# Patient Record
Sex: Female | Born: 1940 | Race: White | Hispanic: No | Marital: Married | Smoking: Never smoker
Health system: Southern US, Community
[De-identification: ages and names within clinical notes are randomized; demographics above are authoritative.]

## PROBLEM LIST (undated history)

## (undated) DIAGNOSIS — R112 Nausea with vomiting, unspecified: Secondary | ICD-10-CM

## (undated) DIAGNOSIS — F419 Anxiety disorder, unspecified: Secondary | ICD-10-CM

## (undated) DIAGNOSIS — J449 Chronic obstructive pulmonary disease, unspecified: Secondary | ICD-10-CM

## (undated) DIAGNOSIS — Z9889 Other specified postprocedural states: Secondary | ICD-10-CM

## (undated) DIAGNOSIS — F429 Obsessive-compulsive disorder, unspecified: Secondary | ICD-10-CM

## (undated) DIAGNOSIS — R7303 Prediabetes: Secondary | ICD-10-CM

## (undated) DIAGNOSIS — K219 Gastro-esophageal reflux disease without esophagitis: Secondary | ICD-10-CM

## (undated) HISTORY — PX: TONSILLECTOMY: SUR1361

## (undated) HISTORY — PX: TUMOR EXCISION: SHX421

## (undated) HISTORY — PX: ABDOMINAL HYSTERECTOMY: SHX81

## (undated) HISTORY — PX: OOPHORECTOMY: SHX86

---

## 2005-09-10 ENCOUNTER — Ambulatory Visit: Payer: Self-pay | Admitting: Internal Medicine

## 2006-09-14 ENCOUNTER — Ambulatory Visit: Payer: Self-pay | Admitting: Internal Medicine

## 2007-03-23 ENCOUNTER — Ambulatory Visit: Payer: Self-pay | Admitting: Gastroenterology

## 2007-10-20 ENCOUNTER — Ambulatory Visit: Payer: Self-pay | Admitting: Internal Medicine

## 2008-04-21 ENCOUNTER — Emergency Department: Payer: Self-pay | Admitting: Emergency Medicine

## 2008-04-23 ENCOUNTER — Other Ambulatory Visit: Payer: Self-pay

## 2008-04-23 ENCOUNTER — Emergency Department: Payer: Self-pay | Admitting: Emergency Medicine

## 2008-10-22 ENCOUNTER — Ambulatory Visit: Payer: Self-pay | Admitting: Internal Medicine

## 2009-10-24 ENCOUNTER — Ambulatory Visit: Payer: Self-pay | Admitting: Internal Medicine

## 2010-10-28 ENCOUNTER — Ambulatory Visit: Payer: Self-pay | Admitting: Internal Medicine

## 2011-05-23 ENCOUNTER — Emergency Department: Payer: Self-pay | Admitting: *Deleted

## 2011-05-27 ENCOUNTER — Emergency Department: Payer: Self-pay | Admitting: Emergency Medicine

## 2011-07-09 ENCOUNTER — Ambulatory Visit: Payer: Self-pay | Admitting: Allergy

## 2011-11-20 ENCOUNTER — Ambulatory Visit: Payer: Self-pay | Admitting: Family Medicine

## 2012-09-21 ENCOUNTER — Ambulatory Visit: Payer: Self-pay | Admitting: Gastroenterology

## 2012-12-01 ENCOUNTER — Ambulatory Visit: Payer: Self-pay | Admitting: Obstetrics and Gynecology

## 2013-11-02 HISTORY — PX: BREAST BIOPSY: SHX20

## 2013-11-30 ENCOUNTER — Emergency Department: Payer: Self-pay | Admitting: Emergency Medicine

## 2013-11-30 LAB — CBC WITH DIFFERENTIAL/PLATELET
Basophil #: 0.1 10*3/uL (ref 0.0–0.1)
Basophil %: 0.4 %
Eosinophil #: 0 10*3/uL (ref 0.0–0.7)
Eosinophil %: 0.3 %
HCT: 39 % (ref 35.0–47.0)
HGB: 13.3 g/dL (ref 12.0–16.0)
LYMPHS PCT: 8.5 %
Lymphocyte #: 1.4 10*3/uL (ref 1.0–3.6)
MCH: 28.6 pg (ref 26.0–34.0)
MCHC: 34 g/dL (ref 32.0–36.0)
MCV: 84 fL (ref 80–100)
MONOS PCT: 7.3 %
Monocyte #: 1.2 x10 3/mm — ABNORMAL HIGH (ref 0.2–0.9)
Neutrophil #: 13.8 10*3/uL — ABNORMAL HIGH (ref 1.4–6.5)
Neutrophil %: 83.5 %
Platelet: 294 10*3/uL (ref 150–440)
RBC: 4.65 10*6/uL (ref 3.80–5.20)
RDW: 14 % (ref 11.5–14.5)
WBC: 16.5 10*3/uL — AB (ref 3.6–11.0)

## 2013-11-30 LAB — URINALYSIS, COMPLETE
Bacteria: NONE SEEN
Bilirubin,UR: NEGATIVE
Blood: NEGATIVE
Glucose,UR: NEGATIVE mg/dL (ref 0–75)
KETONE: NEGATIVE
Nitrite: NEGATIVE
PH: 7 (ref 4.5–8.0)
Protein: NEGATIVE
RBC,UR: 2 /HPF (ref 0–5)
Specific Gravity: 1.017 (ref 1.003–1.030)
Squamous Epithelial: 4
WBC UR: 5 /HPF (ref 0–5)

## 2013-11-30 LAB — COMPREHENSIVE METABOLIC PANEL
ALT: 16 U/L (ref 12–78)
Albumin: 3.9 g/dL (ref 3.4–5.0)
Alkaline Phosphatase: 91 U/L
Anion Gap: 6 — ABNORMAL LOW (ref 7–16)
BUN: 12 mg/dL (ref 7–18)
Bilirubin,Total: 0.5 mg/dL (ref 0.2–1.0)
CHLORIDE: 99 mmol/L (ref 98–107)
Calcium, Total: 9.2 mg/dL (ref 8.5–10.1)
Co2: 31 mmol/L (ref 21–32)
Creatinine: 0.77 mg/dL (ref 0.60–1.30)
Glucose: 111 mg/dL — ABNORMAL HIGH (ref 65–99)
Osmolality: 272 (ref 275–301)
Potassium: 3.1 mmol/L — ABNORMAL LOW (ref 3.5–5.1)
SGOT(AST): 11 U/L — ABNORMAL LOW (ref 15–37)
Sodium: 136 mmol/L (ref 136–145)
TOTAL PROTEIN: 8.1 g/dL (ref 6.4–8.2)

## 2013-11-30 LAB — LIPASE, BLOOD: Lipase: 88 U/L (ref 73–393)

## 2013-12-05 ENCOUNTER — Ambulatory Visit: Payer: Self-pay | Admitting: Obstetrics and Gynecology

## 2013-12-06 ENCOUNTER — Ambulatory Visit: Payer: Self-pay | Admitting: Obstetrics and Gynecology

## 2013-12-11 ENCOUNTER — Ambulatory Visit: Payer: Self-pay | Admitting: Obstetrics and Gynecology

## 2013-12-12 LAB — PATHOLOGY REPORT

## 2014-01-15 ENCOUNTER — Observation Stay: Payer: Self-pay | Admitting: Family Medicine

## 2014-01-15 LAB — CBC WITH DIFFERENTIAL/PLATELET
BASOS ABS: 0 10*3/uL (ref 0.0–0.1)
Basophil %: 0.3 %
EOS PCT: 0.1 %
Eosinophil #: 0 10*3/uL (ref 0.0–0.7)
HCT: 37.1 % (ref 35.0–47.0)
HGB: 12.8 g/dL (ref 12.0–16.0)
LYMPHS ABS: 1.2 10*3/uL (ref 1.0–3.6)
Lymphocyte %: 8 %
MCH: 28.6 pg (ref 26.0–34.0)
MCHC: 34.4 g/dL (ref 32.0–36.0)
MCV: 83 fL (ref 80–100)
MONOS PCT: 11.1 %
Monocyte #: 1.7 x10 3/mm — ABNORMAL HIGH (ref 0.2–0.9)
NEUTROS PCT: 80.5 %
Neutrophil #: 12.6 10*3/uL — ABNORMAL HIGH (ref 1.4–6.5)
PLATELETS: 305 10*3/uL (ref 150–440)
RBC: 4.47 10*6/uL (ref 3.80–5.20)
RDW: 14.4 % (ref 11.5–14.5)
WBC: 15.6 10*3/uL — AB (ref 3.6–11.0)

## 2014-01-15 LAB — URINALYSIS, COMPLETE
BILIRUBIN, UR: NEGATIVE
Glucose,UR: NEGATIVE mg/dL (ref 0–75)
Nitrite: NEGATIVE
PH: 7 (ref 4.5–8.0)
Protein: NEGATIVE
RBC,UR: 3 /HPF (ref 0–5)
SPECIFIC GRAVITY: 1.008 (ref 1.003–1.030)
Squamous Epithelial: 9
Transitional Epi: 2

## 2014-01-15 LAB — COMPREHENSIVE METABOLIC PANEL
ALT: 14 U/L (ref 12–78)
Albumin: 3.2 g/dL — ABNORMAL LOW (ref 3.4–5.0)
Alkaline Phosphatase: 89 U/L
Anion Gap: 8 (ref 7–16)
BUN: 10 mg/dL (ref 7–18)
Bilirubin,Total: 0.7 mg/dL (ref 0.2–1.0)
CALCIUM: 9.3 mg/dL (ref 8.5–10.1)
CHLORIDE: 95 mmol/L — AB (ref 98–107)
Co2: 27 mmol/L (ref 21–32)
Creatinine: 0.73 mg/dL (ref 0.60–1.30)
EGFR (Non-African Amer.): >60
GLUCOSE: 114 mg/dL — AB (ref 65–99)
OSMOLALITY: 261 (ref 275–301)
POTASSIUM: 2.7 mmol/L — AB (ref 3.5–5.1)
SGOT(AST): 13 U/L — ABNORMAL LOW (ref 15–37)
Sodium: 130 mmol/L — ABNORMAL LOW (ref 136–145)
TOTAL PROTEIN: 7.7 g/dL (ref 6.4–8.2)

## 2014-01-15 LAB — LIPASE, BLOOD: Lipase: 74 U/L (ref 73–393)

## 2014-01-16 LAB — CBC WITH DIFFERENTIAL/PLATELET
BASOS ABS: 0 10*3/uL (ref 0.0–0.1)
BASOS PCT: 0.1 %
Eosinophil #: 0.1 10*3/uL (ref 0.0–0.7)
Eosinophil %: 0.8 %
HCT: 34.2 % — ABNORMAL LOW (ref 35.0–47.0)
HGB: 11.8 g/dL — AB (ref 12.0–16.0)
LYMPHS ABS: 2.7 10*3/uL (ref 1.0–3.6)
LYMPHS PCT: 24.9 %
MCH: 28.4 pg (ref 26.0–34.0)
MCHC: 34.4 g/dL (ref 32.0–36.0)
MCV: 83 fL (ref 80–100)
MONO ABS: 1.6 x10 3/mm — AB (ref 0.2–0.9)
Monocyte %: 14.2 %
NEUTROS PCT: 60 %
Neutrophil #: 6.6 10*3/uL — ABNORMAL HIGH (ref 1.4–6.5)
PLATELETS: 305 10*3/uL (ref 150–440)
RBC: 4.14 10*6/uL (ref 3.80–5.20)
RDW: 14.3 % (ref 11.5–14.5)
WBC: 11 10*3/uL (ref 3.6–11.0)

## 2014-01-16 LAB — BASIC METABOLIC PANEL
ANION GAP: 6 — AB (ref 7–16)
BUN: 6 mg/dL — AB (ref 7–18)
Calcium, Total: 8.5 mg/dL (ref 8.5–10.1)
Chloride: 102 mmol/L (ref 98–107)
Co2: 29 mmol/L (ref 21–32)
Creatinine: 0.82 mg/dL (ref 0.60–1.30)
EGFR (African American): >60
EGFR (Non-African Amer.): >60
GLUCOSE: 86 mg/dL (ref 65–99)
OSMOLALITY: 271 (ref 275–301)
Potassium: 2.5 mmol/L — CL (ref 3.5–5.1)
Sodium: 137 mmol/L (ref 136–145)

## 2014-01-16 LAB — MAGNESIUM: Magnesium: 1.1 mg/dL — ABNORMAL LOW

## 2014-01-16 LAB — URINE CULTURE

## 2014-01-17 LAB — CBC WITH DIFFERENTIAL/PLATELET
Basophil #: 0 10*3/uL (ref 0.0–0.1)
Basophil %: 0.5 %
EOS ABS: 0 10*3/uL (ref 0.0–0.7)
EOS PCT: 0 %
HCT: 34.1 % — AB (ref 35.0–47.0)
HGB: 11.6 g/dL — AB (ref 12.0–16.0)
LYMPHS PCT: 10.3 %
Lymphocyte #: 1 10*3/uL (ref 1.0–3.6)
MCH: 28.7 pg (ref 26.0–34.0)
MCHC: 34.2 g/dL (ref 32.0–36.0)
MCV: 84 fL (ref 80–100)
MONO ABS: 0.6 x10 3/mm (ref 0.2–0.9)
MONOS PCT: 6.4 %
Neutrophil #: 8.3 10*3/uL — ABNORMAL HIGH (ref 1.4–6.5)
Neutrophil %: 82.8 %
Platelet: 315 10*3/uL (ref 150–440)
RBC: 4.05 10*6/uL (ref 3.80–5.20)
RDW: 14.6 % — AB (ref 11.5–14.5)
WBC: 10 10*3/uL (ref 3.6–11.0)

## 2014-01-17 LAB — BASIC METABOLIC PANEL
Anion Gap: 8 (ref 7–16)
BUN: 7 mg/dL (ref 7–18)
CHLORIDE: 106 mmol/L (ref 98–107)
CREATININE: 0.67 mg/dL (ref 0.60–1.30)
Calcium, Total: 8.5 mg/dL (ref 8.5–10.1)
Co2: 25 mmol/L (ref 21–32)
EGFR (Non-African Amer.): >60
Glucose: 128 mg/dL — ABNORMAL HIGH (ref 65–99)
OSMOLALITY: 277 (ref 275–301)
POTASSIUM: 3.5 mmol/L (ref 3.5–5.1)
Sodium: 139 mmol/L (ref 136–145)

## 2014-01-18 LAB — BASIC METABOLIC PANEL
Anion Gap: 8 (ref 7–16)
BUN: 13 mg/dL (ref 7–18)
CHLORIDE: 104 mmol/L (ref 98–107)
Calcium, Total: 9 mg/dL (ref 8.5–10.1)
Co2: 25 mmol/L (ref 21–32)
Creatinine: 0.69 mg/dL (ref 0.60–1.30)
EGFR (African American): >60
Glucose: 100 mg/dL — ABNORMAL HIGH (ref 65–99)
OSMOLALITY: 274 (ref 275–301)
Potassium: 3.4 mmol/L — ABNORMAL LOW (ref 3.5–5.1)
Sodium: 137 mmol/L (ref 136–145)

## 2014-12-10 ENCOUNTER — Ambulatory Visit: Payer: Self-pay | Admitting: Obstetrics and Gynecology

## 2014-12-11 ENCOUNTER — Ambulatory Visit: Payer: Self-pay | Admitting: Obstetrics and Gynecology

## 2015-02-23 NOTE — Discharge Summary (Signed)
PATIENT NAME:  Gail Salinas, Gail Salinas MR#:  [id number] DATE OF BIRTH:  October 08, 1941  DATE OF ADMISSION:  01/15/2014 DATE OF DISCHARGE:  01/18/2014  DISCHARGE DIAGNOSES: 1.  Intractable nausea, vomiting that has resolved.  2.  Chronic hypomagnesemia and hypokalemia.  3.  Urinary tract infection.   DISCHARGE MEDICATIONS: 1.  Potassium chloride 20 mEq 4 times a day, which is per her home dose.  2.  Aspirin 81 mg p.o. daily.  3.  Gabapentin 300 mg p.o. at bedtime as needed for neuropathic pain.  4.  Montelukast 10 mg p.o. daily.  5.  Magnesium oxide 500 mg 1 tab p.o. b.i.d.  6.  Caltrate 600 mg 1 tab p.o. daily.  7.  Omeprazole 20 mg p.o. daily.  8.  Acetaminophen 650 mg p.o. q.4 hours as needed for pain and fever.  9.  Ciprofloxacin 500 mg p.o. b.i.d. x 4 more days.   CONSULTS: None.   PROCEDURES: None.  PERTINENT LABORATORIES AND STUDIES: On day of discharge, sodium 137, potassium 3.4, creatinine 0.69, glucose 100. Magnesium level prior to discharge was 1.1. Urinalysis was positive.   BRIEF HOSPITAL COURSE:  1.  Intractable nausea, vomiting. The patient initially was admitted for intractable nausea and vomiting for several days. She was given Zofran and it was unclear if it was secondary to the Ultram.  The Ultram was discontinued. Her symptoms did resolve during her hospitalization and she was able to advance her diet without complications.  2. Urinary tract infection. She was initially found to have a positive urinalysis. Urine culture showed contamination and no specific bacteria grew out, but she was symptomatic; therefore, she was treated for a 7 day course of ciprofloxacin, may contribute to her nausea and vomiting.  3.  Chronic hypokalemia and hypomagnesemia exacerbated by the emesis. She was given supplementation as per her home dose.   Other chronic issues remained stable. Plan to DC to home with followup with Dr. Netty Starring within 10 days.   ____________________________ Dion Body, MD kl:dmm D: 01/18/2014 08:17:49 ET T: 01/18/2014 10:03:22 ET JOB#: [id number]  cc: Dion Body, MD, <Dictator> Dion Body MD ELECTRONICALLY SIGNED 02/05/2014 10:35

## 2015-02-23 NOTE — H&P (Signed)
PATIENT NAME:  Gail Salinas, Gail Salinas MR#:  [id number] DATE OF BIRTH:  [birth year]  DATE OF ADMISSION:  01/15/2014  PRIMARY CARE PHYSICIAN: Dr. Netty Starring.  EMERGENCY ROOM PHYSICIAN: Dr. Corky Downs.   CHIEF COMPLAINT: Intractable nausea and vomiting for nine days.   HISTORY OF PRESENT ILLNESS: A 74 year old female patient having nausea, vomiting, unable to keep anything down for nine days. The patient has no abdominal pain. No diarrhea. constipated for nine days had a bowel movement yesterday. The patient was started on tramadol for her shingles, that was started on 03/10. The patient started to have nausea, vomiting, even before that but got aggravated. The patient started on tramadol acyclovir for her shingles. She is on tramadol 50 mg t.i.d., and she was taking it all through this time, until yesterday and she is also on valacyclovir 1 gram p.o. every eight hours for 21 tablets. The patient has like a shingles rash in right groin area. The patient also complains of left knee effusion and she wants that to be drained, unable to walk because of the left knee effusion. Mentions that she gets the effusion drained by [facility] orthopedic on a regular basis. The patient denies any other complaints, but feels very weak and tired. The patient found to have potassium of 2.7 and she received Phenergan and Zofran in the ER and persistently feeling weak, so we are admitting the patient.  PAST MEDICAL HISTORY:  1. Significant for a history of allergies. 2. Recent shingles.  3. Hypokalemia.   SOCIAL HISTORY: No smoking. No drinking.   PAST SURGICAL HISTORY: Significant for hysterectomy a long time back.   ALLERGIES: IS ALLERGIC TO CODEINE.   MEDICATIONS AT HOME:  1. Aspirin 81 mg daily.  2. Neurontin 300 mg p.o. at bedtime.  3. Hydroxyzine hydrochloride 50 mg p.o. t.i.d.  4. KCl 20 mEq  p.o. 4 times daily.  5. Magnesium oxide 500 mg p.o. b.i.d.  6. Singulair 10 mg p.o. daily.  7. Tramadol 50 mg p.o. t.i.d.  8.  Valtrex 1 gram p.o. every eight hours.   FAMILY HISTORY: Significant for diabetes in both parents.  REVIEW OF SYSTEMS:  CONSTITUTIONAL: Feels very weak.  EYES: No blurred vision.  ENT: No tinnitus. No ear pain. No epistaxis.  RESPIRATORY: No cough. No wheezing.  CARDIOVASCULAR: No chest pain. No orthopnea. No PND.  GASTROINTESTINAL: Has nausea and vomiting since nine days. No diarrhea. No abdominal pain. No hematemesis. Does have a history of constipation.  GENITOURINARY: No dysuria. No hematuria.  ENDOCRINE: No polyuria or nocturia.  INTEGUMENTARY: The patient has shingles lesions on her right groin which are dry with no drainage.  MUSCULOSKELETAL: Has left knee effusion and unable to ambulate due to the left knee effusion. The patient has been having this effusion since Friday. No fever. The patient has no history of gout and the patient has no tenderness in the left knee.  NEUROLOGIC: No numbness or weakness.  PSYCHIATRIC: No anxiety or insomnia.   PHYSICAL EXAMINATION: VITAL SIGNS: Temperature 97.8, heart rate 91, blood pressure 138/70, sats 100% on room air. GENERAL: Alert, awake, oriented, well-developed, well-nourished female, not in distress. Answering questions appropriately.  HEENT: Head atraumatic, normocephalic.  EYES: Pupils equally reacting to light. Extraocular movements are intact.  NOSE: No nasal lesions. No drainage.  EARS: No tympanic membrane or congestion. No external lesions.  MOUTH: No lesions. No exudates.  NECK: Supple. No JVD. No carotid bruit   RESPIRATORY: Good respiratory effort. Clear to auscultation. No wheeze. No rales.  CARDIOVASCULAR:  Regular rate and rhythm, no murmurs. The patient's pulses are equal at carotid, femoral, and also dorsalis pedis,no  peripheral edema.  GASTROINTESTINAL: Abdomen is nontender, nondistended. Bowel sounds present. No organomegaly.  MUSCULOSKELETAL: Left knee effusion present. The patient movement at the knee, flexion and  extension are limited because of swelling. The patient's range of motion is limited. Strength in intact both knees. Strength is intact in both upper and lower extremities.  SKIN: Inspection is normal except that she has dried Shingles rash present in the right groin  LYMPHATICS: No cervical lymphadenopathy.  NEUROLOGIC: Cranial nerves II through XII are intact. Power 5/5 in upper and lower extremities. Sensation intact  .dtr 2+   bilaterally.  PSYCHIATRIC: Mood and affect are within normal limits.   LABORATORY DATA: C                    HI  15.6                 3.6-11.0       x10^3/mm^3        RBC                        4.47                 3.80-5.20      X10^6/mm^3        HGB                        12.8                 12.0-16.0      g/dL              HCT                        37.1                 35.0-47.0      %                 MCV                        83                   80-100         fL                MCH                        28.6                 26.0-34.0      pg                MCHC                       34.4                 32.0-36.0      g/dL              RDW                        14.4                 11.5-14.5      %  PLATELET                   305                  150-440        x10^3/mm^3        NEUTROPHIL %               80.5                 -              %                 LYMPHOCYTE %               8.0    Electrolytes: Sodium 130, potassium 2.7, chloride 95, bicarbonate 27, BUN 10, creatinine 0.73, glucose 114. Lipase 74.    EKG showed normal sinus rhythm with 91 beats per minute, small ST depressions in V4 with T wave inversions in V4, V5, V6.   Urinalysis showed hazy urine, 1+ ketones, 1+ blood, 3+ leukocyte esterase, 1+ bacteria, 29 WBC.   ASSESSMENT AND PLAN: 1. The patient is a 74 year old female patient with intractable nausea, vomiting, for about nine days. The patient likely has this nausea vomiting due to probably due to the tramadol because she  mentioned that every time she takes tramadol the symptoms are worse. I believe this is probably the side effect from tramadol. I am stopping the tramadol. Continue IV fluids along with potassium replacement and IV Zofran will be given for nausea and vomiting. Continue Protonix for GI prophylaxis and monitor potassium tomorrow. The patient will be admitted to observation status.  2. Left knee effusion. The patient will get orthopedic evaluation for arthrocentesis.  3. Urinary tract infection. Continue Rocephin and follow cultures.  4. History of shingles. The patient was started on acyclovir on the 10th.  She is to take for seven days so she will continue today and  tomorrow and use Tylenol for the pain.  5. Leukocytosis probably reactive because of her urinary tract infection, continue Rocephin and get arthrocentesis from orthopedics. The patient says that she has seen Dr.  Marry Guan before from [facility]. We will call [facility] and call for the orthopedic consult and Dr. Netty Starring will resume service tomorrow.   TIME SPENT: About 55 minutes.    ____________________________ Epifanio Lesches, MD sk:sg D: 01/15/2014 11:08:22 ET T: 01/15/2014 14:24:24 ET JOB#: [id number]  cc: Epifanio Lesches, MD, <Dictator> Epifanio Lesches MD ELECTRONICALLY SIGNED 01/17/2014 13:52

## 2015-02-23 NOTE — Consult Note (Signed)
Brief Consult Note: Diagnosis: Effusions and knee chondrocalcinosis.   Patient was seen by consultant.   Recommend to proceed with surgery or procedure.   Recommend further assessment or treatment.   Orders entered.   Comments: 74 year old female has known history of chondrocalcinosis last aspirated 2 years ago at [facility]. Recently has increase and swelling of both knees primarily the left.  Diffuculty walking.  Has had shingles and nausea/vomiting.  No fever or reddness of knees.   Exam:  alert and cooperative.  circulation/sensation/motor function good both legs.  2+ effusion left knee and minimal on right.  range of motion restricted by discomfort.  Ligaments stable.  No reddness and minimal pain to palpation.    X-rays:  chondrocalciosis left knee with mild arthritis  Imp:  Pseudogout  Rx:  Aspirated 60 cc serous fluid from left knee        Injected with 40 mg Kenalog        Ice both knees        Oral NSAIDs when able to tolerate po medications.         Ambulate as tolerated        return to clinic as needed..  Electronic Signatures: Park Breed (MD)  (Signed 17-Mar-15 13:54)  Authored: Brief Consult Note   Last Updated: 17-Mar-15 13:54 by Park Breed (MD)

## 2015-12-17 ENCOUNTER — Other Ambulatory Visit: Payer: Self-pay | Admitting: Obstetrics and Gynecology

## 2015-12-17 DIAGNOSIS — Z1231 Encounter for screening mammogram for malignant neoplasm of breast: Secondary | ICD-10-CM

## 2015-12-20 ENCOUNTER — Ambulatory Visit
Admission: RE | Admit: 2015-12-20 | Discharge: 2015-12-20 | Disposition: A | Discharge status: Home / Self Care | Payer: Medicare Other | Source: Ambulatory Visit | Attending: Obstetrics and Gynecology | Admitting: Obstetrics and Gynecology

## 2015-12-20 DIAGNOSIS — Z1231 Encounter for screening mammogram for malignant neoplasm of breast: Secondary | ICD-10-CM | POA: Diagnosis not present

## 2016-06-10 DIAGNOSIS — Z0000 Encounter for general adult medical examination without abnormal findings: Secondary | ICD-10-CM | POA: Insufficient documentation

## 2016-06-10 DIAGNOSIS — R7303 Prediabetes: Secondary | ICD-10-CM | POA: Insufficient documentation

## 2016-12-21 ENCOUNTER — Other Ambulatory Visit: Payer: Self-pay | Admitting: Obstetrics and Gynecology

## 2016-12-21 DIAGNOSIS — Z1231 Encounter for screening mammogram for malignant neoplasm of breast: Secondary | ICD-10-CM

## 2017-01-04 ENCOUNTER — Ambulatory Visit
Admission: RE | Admit: 2017-01-04 | Discharge: 2017-01-04 | Disposition: A | Discharge status: Home / Self Care | Payer: Medicare Other | Source: Ambulatory Visit | Attending: Obstetrics and Gynecology | Admitting: Obstetrics and Gynecology

## 2017-01-04 DIAGNOSIS — Z1231 Encounter for screening mammogram for malignant neoplasm of breast: Secondary | ICD-10-CM | POA: Insufficient documentation

## 2017-12-21 ENCOUNTER — Other Ambulatory Visit: Payer: Self-pay | Admitting: Obstetrics and Gynecology

## 2017-12-21 DIAGNOSIS — Z1231 Encounter for screening mammogram for malignant neoplasm of breast: Secondary | ICD-10-CM

## 2018-01-10 ENCOUNTER — Ambulatory Visit
Admission: RE | Admit: 2018-01-10 | Discharge: 2018-01-10 | Disposition: A | Discharge status: Home / Self Care | Payer: Medicare Other | Source: Ambulatory Visit | Attending: Obstetrics and Gynecology | Admitting: Obstetrics and Gynecology

## 2018-01-10 DIAGNOSIS — Z1231 Encounter for screening mammogram for malignant neoplasm of breast: Secondary | ICD-10-CM | POA: Diagnosis present

## 2018-05-18 ENCOUNTER — Encounter: Payer: Self-pay | Admitting: *Deleted

## 2018-05-19 ENCOUNTER — Encounter
Admission: RE | Disposition: A | Discharge status: Home / Self Care | Payer: Self-pay | Source: Ambulatory Visit | Attending: Gastroenterology

## 2018-05-19 ENCOUNTER — Encounter: Payer: Self-pay | Admitting: Anesthesiology

## 2018-05-19 ENCOUNTER — Ambulatory Visit: Payer: Medicare Other | Admitting: Anesthesiology

## 2018-05-19 ENCOUNTER — Ambulatory Visit
Admission: RE | Admit: 2018-05-19 | Discharge: 2018-05-19 | Disposition: A | Discharge status: Home / Self Care | Payer: Medicare Other | Source: Ambulatory Visit | Attending: Gastroenterology | Admitting: Gastroenterology

## 2018-05-19 DIAGNOSIS — K5730 Diverticulosis of large intestine without perforation or abscess without bleeding: Secondary | ICD-10-CM | POA: Insufficient documentation

## 2018-05-19 DIAGNOSIS — Z7982 Long term (current) use of aspirin: Secondary | ICD-10-CM | POA: Insufficient documentation

## 2018-05-19 DIAGNOSIS — Z79899 Other long term (current) drug therapy: Secondary | ICD-10-CM | POA: Insufficient documentation

## 2018-05-19 DIAGNOSIS — Z888 Allergy status to other drugs, medicaments and biological substances status: Secondary | ICD-10-CM | POA: Insufficient documentation

## 2018-05-19 DIAGNOSIS — E876 Hypokalemia: Secondary | ICD-10-CM | POA: Insufficient documentation

## 2018-05-19 DIAGNOSIS — F429 Obsessive-compulsive disorder, unspecified: Secondary | ICD-10-CM | POA: Diagnosis not present

## 2018-05-19 DIAGNOSIS — Z800 Family history of malignant neoplasm of digestive organs: Secondary | ICD-10-CM | POA: Insufficient documentation

## 2018-05-19 DIAGNOSIS — Z885 Allergy status to narcotic agent status: Secondary | ICD-10-CM | POA: Insufficient documentation

## 2018-05-19 DIAGNOSIS — K621 Rectal polyp: Secondary | ICD-10-CM | POA: Insufficient documentation

## 2018-05-19 DIAGNOSIS — Z86010 Personal history of colonic polyps: Secondary | ICD-10-CM | POA: Diagnosis present

## 2018-05-19 DIAGNOSIS — J449 Chronic obstructive pulmonary disease, unspecified: Secondary | ICD-10-CM | POA: Insufficient documentation

## 2018-05-19 DIAGNOSIS — F419 Anxiety disorder, unspecified: Secondary | ICD-10-CM | POA: Insufficient documentation

## 2018-05-19 DIAGNOSIS — K219 Gastro-esophageal reflux disease without esophagitis: Secondary | ICD-10-CM | POA: Diagnosis not present

## 2018-05-19 HISTORY — PX: COLONOSCOPY WITH PROPOFOL: SHX5780

## 2018-05-19 HISTORY — DX: Gastro-esophageal reflux disease without esophagitis: K21.9

## 2018-05-19 HISTORY — DX: Obsessive-compulsive disorder, unspecified: F42.9

## 2018-05-19 HISTORY — DX: Chronic obstructive pulmonary disease, unspecified: J44.9

## 2018-05-19 HISTORY — DX: Anxiety disorder, unspecified: F41.9

## 2018-05-19 LAB — POCT I-STAT 4, (NA,K, GLUC, HGB,HCT)
Glucose, Bld: 103 mg/dL — ABNORMAL HIGH (ref 70–99)
HCT: 38 % (ref 36.0–46.0)
HEMOGLOBIN: 12.9 g/dL (ref 12.0–15.0)
Potassium: 3 mmol/L — ABNORMAL LOW (ref 3.5–5.1)
SODIUM: 140 mmol/L (ref 135–145)

## 2018-05-19 SURGERY — COLONOSCOPY WITH PROPOFOL
Anesthesia: General

## 2018-05-19 MED ORDER — LIDOCAINE HCL (CARDIAC) PF 100 MG/5ML IV SOSY
PREFILLED_SYRINGE | INTRAVENOUS | Status: DC | PRN
  Administered 2018-05-19: 60 mg via INTRAVENOUS

## 2018-05-19 MED ORDER — PROPOFOL 10 MG/ML IV BOLUS
INTRAVENOUS | Status: DC | PRN
  Administered 2018-05-19: 50 mg via INTRAVENOUS
  Administered 2018-05-19: 60 mg via INTRAVENOUS
  Administered 2018-05-19: 40 mg via INTRAVENOUS

## 2018-05-19 MED ORDER — SODIUM CHLORIDE 0.9 % IV SOLN
INTRAVENOUS | Status: DC
  Administered 2018-05-19: 1000 mL via INTRAVENOUS

## 2018-05-19 MED ORDER — PROPOFOL 10 MG/ML IV BOLUS
INTRAVENOUS | Status: AC
  Filled 2018-05-19: qty 40

## 2018-05-19 MED ORDER — PROPOFOL 500 MG/50ML IV EMUL
INTRAVENOUS | Status: DC | PRN
  Administered 2018-05-19: 100 ug/kg/min via INTRAVENOUS

## 2018-05-19 NOTE — Transfer of Care (Signed)
Immediate Anesthesia Transfer of Care Note  Patient: Gail Salinas  Procedure(s) Performed: COLONOSCOPY WITH PROPOFOL (N/A )  Patient Location: PACU  Anesthesia Type:General  Level of Consciousness: sedated  Airway & Oxygen Therapy: Patient Spontanous Breathing and Patient connected to nasal cannula oxygen  Post-op Assessment: Report given to RN and Post -op Vital signs reviewed and stable  Post vital signs: Reviewed and stable  Last Vitals:  Vitals Value Taken Time  BP    Temp    Pulse 60 05/19/2018 10:25 AM  Resp 10 05/19/2018 10:25 AM  SpO2 96 % 05/19/2018 10:25 AM  Vitals shown include unvalidated device data.  Last Pain:  Vitals:   05/19/18 0833  TempSrc: Tympanic  PainSc: 3          Complications: No apparent anesthesia complications

## 2018-05-19 NOTE — Anesthesia Preprocedure Evaluation (Signed)
Anesthesia Evaluation  Patient identified by MRN, date of birth, ID band Patient awake    Reviewed: Allergy & Precautions, H&P , NPO status , Patient's Chart, lab work & pertinent test results  History of Anesthesia Complications Negative for: history of anesthetic complications  Airway Mallampati: III  TM Distance: <3 FB Neck ROM: limited    Dental  (+) Chipped, Poor Dentition, Caps   Pulmonary neg shortness of breath, COPD,           Cardiovascular Exercise Tolerance: Good (-) angina(-) Past MI and (-) DOE negative cardio ROS       Neuro/Psych PSYCHIATRIC DISORDERS Anxiety negative neurological ROS     GI/Hepatic Neg liver ROS, GERD  Medicated and Controlled,  Endo/Other  negative endocrine ROS  Renal/GU negative Renal ROS  negative genitourinary   Musculoskeletal   Abdominal   Peds  Hematology negative hematology ROS (+)   Anesthesia Other Findings Past Medical History: No date: Anxiety No date: COPD (chronic obstructive pulmonary disease) (HCC) No date: GERD (gastroesophageal reflux disease) No date: OCD (obsessive compulsive disorder)  Past Surgical History: No date: ABDOMINAL HYSTERECTOMY 2015: BREAST BIOPSY; Left     Comment:  Negative No date: TONSILLECTOMY  BMI    Body Mass Index:  33.46 kg/m      Reproductive/Obstetrics negative OB ROS                             Anesthesia Physical Anesthesia Plan  ASA: III  Anesthesia Plan: General   Post-op Pain Management:    Induction: Intravenous  PONV Risk Score and Plan: Propofol infusion and TIVA  Airway Management Planned: Natural Airway and Nasal Cannula  Additional Equipment:   Intra-op Plan:   Post-operative Plan:   Informed Consent: I have reviewed the patients History and Physical, chart, labs and discussed the procedure including the risks, benefits and alternatives for the proposed anesthesia with  the patient or authorized representative who has indicated his/her understanding and acceptance.   Dental Advisory Given  Plan Discussed with: Anesthesiologist, CRNA and Surgeon  Anesthesia Plan Comments: (Patient consented for risks of anesthesia including but not limited to:  - adverse reactions to medications - risk of intubation if required - damage to teeth, lips or other oral mucosa - sore throat or hoarseness - Damage to heart, brain, lungs or loss of life  Patient voiced understanding.)        Anesthesia Quick Evaluation

## 2018-05-19 NOTE — Anesthesia Postprocedure Evaluation (Signed)
Anesthesia Post Note  Patient: Gail Salinas  Procedure(s) Performed: COLONOSCOPY WITH PROPOFOL (N/A )  Patient location during evaluation: Endoscopy Anesthesia Type: General Level of consciousness: awake and alert Pain management: pain level controlled Vital Signs Assessment: post-procedure vital signs reviewed and stable Respiratory status: spontaneous breathing, nonlabored ventilation, respiratory function stable and patient connected to nasal cannula oxygen Cardiovascular status: blood pressure returned to baseline and stable Postop Assessment: no apparent nausea or vomiting Anesthetic complications: no     Last Vitals:  Vitals:   05/19/18 1030 05/19/18 1040  BP: 111/63 132/64  Pulse: 63 61  Resp: 16 16  Temp:    SpO2: 97% 100%    Last Pain:  Vitals:   05/19/18 1020  TempSrc: Tympanic  PainSc:                  Precious Haws Marsena Taff

## 2018-05-19 NOTE — Op Note (Signed)
[facility] Gastroenterology Patient Name: Gail Salinas Procedure Date: 05/19/2018 9:20 AM MRN: [id number] Account #: [id number] Date of Birth: May 18, 1941 Admit Type: Outpatient Age: 77 Room: 21 [facility] ENDO ROOM 1 Gender: Female Note Status: Finalized Procedure:            Colonoscopy Indications:          Family history of colon cancer in a first-degree                        relative, Personal history of colonic polyps Providers:            Lollie Sails, MD Referring MD:         Dion Body (Referring MD) Medicines:            Monitored Anesthesia Care Complications:        No immediate complications. Procedure:            Pre-Anesthesia Assessment:                       - ASA Grade Assessment: III - A patient with severe                        systemic disease.                       After obtaining informed consent, the colonoscope was                        passed under direct vision. Throughout the procedure,                        the patient's blood pressure, pulse, and oxygen                        saturations were monitored continuously. The                        Colonoscope was introduced through the anus and                        advanced to the the cecum, identified by appendiceal                        orifice and ileocecal valve. The colonoscopy was                        performed without difficulty. The patient tolerated the                        procedure well. The quality of the bowel preparation                        was good. Findings:      Multiple small-mouthed diverticula were found in the sigmoid colon and       descending colon.      Three sessile polyps were found in the rectum. The polyps were 1 mm in       size. These polyps were removed with a cold biopsy forceps. Resection       and retrieval were complete.      The retroflexed view of the distal  rectum and anal verge was normal and       showed no anal or rectal  abnormalities.      The digital rectal exam was normal. Impression:           - Diverticulosis in the sigmoid colon and in the                        descending colon.                       - Three 1 mm polyps in the rectum, removed with a cold                        biopsy forceps. Resected and retrieved.                       - The distal rectum and anal verge are normal on                        retroflexion view. Recommendation:       - Discharge patient to home. Procedure Code(s):    --- Professional ---                       [phone], Colonoscopy, flexible; with biopsy, single or                        multiple Diagnosis Code(s):    --- Professional ---                       K62.1, Rectal polyp                       Z80.0, Family history of malignant neoplasm of                        digestive organs                       Z86.010, Personal history of colonic polyps                       K57.30, Diverticulosis of large intestine without                        perforation or abscess without bleeding CPT copyright 2017 American Medical Association. All rights reserved. The codes documented in this report are preliminary and upon coder review may  be revised to meet current compliance requirements. Lollie Sails, MD 05/19/2018 10:24:27 AM This report has been signed electronically. Number of Addenda: 0 Note Initiated On: 05/19/2018 9:20 AM Scope Withdrawal Time: 0 hours 7 minutes 46 seconds  Total Procedure Duration: 0 hours 27 minutes 17 seconds       [facility]

## 2018-05-19 NOTE — Anesthesia Post-op Follow-up Note (Signed)
Anesthesia QCDR form completed.        

## 2018-05-20 ENCOUNTER — Encounter: Payer: Self-pay | Admitting: Gastroenterology

## 2018-05-20 LAB — SURGICAL PATHOLOGY

## 2018-05-20 NOTE — H&P (Signed)
Outpatient short stay form Pre-procedure 05/20/2018 6:15 PM Lollie Sails MD  Primary Physician: Dr Dion Body  Reason for visit: Colonoscopy  History of present illness: Patient is a 77 year old female presenting today for a colonoscopy in regards personal history of colon polyps as well as family history of colon cancer primary relative, father.  Tolerating her prep well.  She takes no aspirin or blood thinning agents with the exception of 81mg  aspirin.  Does have a history of chronic hypokalemia and we are checking her potassium today.   No current facility-administered medications for this encounter.   Current Outpatient Medications:  .  aspirin EC 81 MG tablet, Take 81 mg by mouth daily., Disp: , Rfl:  .  calcium carbonate (OSCAL) 1500 (600 Ca) MG TABS tablet, Take 1,500 mg by mouth 2 (two) times daily with a meal., Disp: , Rfl:  .  escitalopram (LEXAPRO) 5 MG tablet, Take 5 mg by mouth daily., Disp: , Rfl:  .  magnesium oxide (MAG-OX) 400 MG tablet, Take 400 mg by mouth daily., Disp: , Rfl:  .  Melatonin CR 3 MG TBCR, Take 1 tablet by mouth at bedtime., Disp: , Rfl:  .  montelukast (SINGULAIR) 10 MG tablet, Take 10 mg by mouth at bedtime., Disp: , Rfl:  .  omeprazole (PRILOSEC) 20 MG capsule, Take 20 mg by mouth daily., Disp: , Rfl:  .  potassium chloride (K-DUR) 10 MEQ tablet, Take 10 mEq by mouth daily., Disp: , Rfl:   No medications prior to admission.     Allergies  Allergen Reactions  . Advair Diskus [Fluticasone-Salmeterol]   . Codeine   . Prednisone      Past Medical History:  Diagnosis Date  . Anxiety   . COPD (chronic obstructive pulmonary disease) ([place])   . GERD (gastroesophageal reflux disease)   . OCD (obsessive compulsive disorder)     Review of systems:      Physical Exam    Heart and lungs: Without rub or gallop, lungs are bilaterally clear.    HEENT: Normocephalic atraumatic eyes are anicteric    Other:    Pertinant exam for  procedure: Soft nontender nondistended bowel sounds positive normoactive    Planned proceedures: Colonoscopy and indicated procedures. I have discussed the risks benefits and complications of procedures to include not limited to bleeding, infection, perforation and the risk of sedation and the patient wishes to proceed.    Lollie Sails, MD Gastroenterology 05/20/2018  6:15 PM

## 2018-06-22 DIAGNOSIS — E669 Obesity, unspecified: Secondary | ICD-10-CM | POA: Insufficient documentation

## 2019-01-02 ENCOUNTER — Other Ambulatory Visit: Payer: Self-pay | Admitting: Obstetrics and Gynecology

## 2019-01-02 DIAGNOSIS — Z1231 Encounter for screening mammogram for malignant neoplasm of breast: Secondary | ICD-10-CM

## 2019-01-09 ENCOUNTER — Other Ambulatory Visit: Payer: Self-pay | Admitting: Obstetrics and Gynecology

## 2019-01-09 DIAGNOSIS — E2839 Other primary ovarian failure: Secondary | ICD-10-CM

## 2019-01-12 ENCOUNTER — Ambulatory Visit
Admission: RE | Admit: 2019-01-12 | Discharge: 2019-01-12 | Disposition: A | Discharge status: Home / Self Care | Payer: Medicare Other | Source: Ambulatory Visit | Attending: Obstetrics and Gynecology | Admitting: Obstetrics and Gynecology

## 2019-01-12 ENCOUNTER — Other Ambulatory Visit: Payer: Self-pay

## 2019-01-12 DIAGNOSIS — Z1231 Encounter for screening mammogram for malignant neoplasm of breast: Secondary | ICD-10-CM | POA: Insufficient documentation

## 2019-12-29 ENCOUNTER — Other Ambulatory Visit: Payer: Self-pay | Admitting: Obstetrics and Gynecology

## 2019-12-29 DIAGNOSIS — Z1231 Encounter for screening mammogram for malignant neoplasm of breast: Secondary | ICD-10-CM

## 2020-01-16 ENCOUNTER — Ambulatory Visit
Admission: RE | Admit: 2020-01-16 | Discharge: 2020-01-16 | Disposition: A | Discharge status: Home / Self Care | Payer: Medicare Other | Source: Ambulatory Visit | Attending: Obstetrics and Gynecology | Admitting: Obstetrics and Gynecology

## 2020-01-16 DIAGNOSIS — Z1231 Encounter for screening mammogram for malignant neoplasm of breast: Secondary | ICD-10-CM

## 2020-01-26 ENCOUNTER — Ambulatory Visit: Payer: Medicare Other | Attending: Internal Medicine

## 2020-01-26 DIAGNOSIS — Z23 Encounter for immunization: Secondary | ICD-10-CM

## 2020-01-26 NOTE — Progress Notes (Signed)
   Covid-19 Vaccination Clinic  Name:  Gail Salinas    MRN: [id number] DOB: May 11, 1941  01/26/2020  [name] was observed post Covid-19 immunization for 15 minutes without incident. She was provided with Vaccine Information Sheet and instruction to access the V-Safe system.   [name] was instructed to call 911 with any severe reactions post vaccine: Marland Kitchen Difficulty breathing  . Swelling of face and throat  . A fast heartbeat  . A bad rash all over body  . Dizziness and weakness   Immunizations Administered    Name Date Dose VIS Date Route   Pfizer COVID-19 Vaccine 01/26/2020  1:05 PM 0.3 mL 10/13/2019 Intramuscular   Manufacturer: [place]   Lot: [id number]   [place]: [id number]

## 2020-02-20 ENCOUNTER — Ambulatory Visit: Payer: Medicare Other | Attending: Internal Medicine

## 2020-02-20 DIAGNOSIS — Z23 Encounter for immunization: Secondary | ICD-10-CM

## 2020-02-20 NOTE — Progress Notes (Signed)
   Covid-19 Vaccination Clinic  Name:  Gail Salinas    MRN: [id number] DOB: October 23, 1941  02/20/2020  [name] was observed post Covid-19 immunization for 15 minutes without incident. She was provided with Vaccine Information Sheet and instruction to access the V-Safe system.   [name] was instructed to call 911 with any severe reactions post vaccine: Marland Kitchen Difficulty breathing  . Swelling of face and throat  . A fast heartbeat  . A bad rash all over body  . Dizziness and weakness   Immunizations Administered    Name Date Dose VIS Date Route   Pfizer COVID-19 Vaccine 02/20/2020 12:23 PM 0.3 mL 12/27/2018 Intramuscular   Manufacturer: [place]   Lot: [id number]   [place]: [id number]

## 2020-11-02 DIAGNOSIS — Z8719 Personal history of other diseases of the digestive system: Secondary | ICD-10-CM

## 2020-11-02 HISTORY — DX: Personal history of other diseases of the digestive system: Z87.19

## 2020-12-31 ENCOUNTER — Other Ambulatory Visit: Payer: Self-pay | Admitting: Obstetrics and Gynecology

## 2020-12-31 DIAGNOSIS — Z1231 Encounter for screening mammogram for malignant neoplasm of breast: Secondary | ICD-10-CM

## 2021-01-16 ENCOUNTER — Ambulatory Visit
Admission: RE | Admit: 2021-01-16 | Discharge: 2021-01-16 | Disposition: A | Discharge status: Home / Self Care | Payer: Medicare Other | Source: Ambulatory Visit | Attending: Obstetrics and Gynecology | Admitting: Obstetrics and Gynecology

## 2021-01-16 ENCOUNTER — Other Ambulatory Visit: Payer: Self-pay

## 2021-01-16 DIAGNOSIS — Z1231 Encounter for screening mammogram for malignant neoplasm of breast: Secondary | ICD-10-CM | POA: Insufficient documentation

## 2021-05-26 ENCOUNTER — Ambulatory Visit: Payer: Self-pay | Admitting: Surgery

## 2021-05-28 ENCOUNTER — Encounter: Payer: Self-pay | Admitting: Surgery

## 2021-05-28 ENCOUNTER — Other Ambulatory Visit: Payer: Self-pay

## 2021-05-28 ENCOUNTER — Ambulatory Visit (INDEPENDENT_AMBULATORY_CARE_PROVIDER_SITE_OTHER): Payer: Medicare Other | Admitting: Surgery

## 2021-05-28 VITALS — BP 127/84 | HR 78 | Temp 99.0°F | Ht 62.0 in | Wt 174.0 lb

## 2021-05-28 DIAGNOSIS — K449 Diaphragmatic hernia without obstruction or gangrene: Secondary | ICD-10-CM | POA: Diagnosis not present

## 2021-05-28 NOTE — Patient Instructions (Addendum)
Recommendations; Stay away from carbonated beverages. Eat 5-6 small meals a day.   We will get you set up for a swallow study, this will be done at [facility].  We will also get you set up for a CT scan of your chest and abdomen with contrast.  You will also need to have an upper endoscopy done. We may be able to get you in sooner for this.    We will call you about these appointments.   We will have you follow up here after all these are done.

## 2021-05-29 ENCOUNTER — Telehealth: Payer: Self-pay

## 2021-05-29 NOTE — Telephone Encounter (Signed)
Spoke with the patient's husband about her imaging studies. Patient is schedule for a barium swallow at [facility] on 06/04/21 at 12:00 pm. She will need to arrive at the [place] entrance by 11:15 am and be sure to have her lab work done before the barium swallow study. She needs to have nothing to eat or drink for 3 hours prior.   She is scheduled for a CT scan of the chest and abdomen with contrast at [facility] on 06/12/21 at 1:30 pm. She will need to arrive there by 1:15 pm and will need to have nothing to eat or drink for 4 hours prior. She will need to pick up a prep kit at least 3 days prior to her scan.   Patient's husband verbalizes understanding.

## 2021-05-29 NOTE — Progress Notes (Signed)
Patient ID: Gail Salinas, female   DOB: May 17, 1941, 80 y.o.   MRN: [id number]  HPI Gail Salinas is a 80 y.o. female seen in consultation at the request of Dr. Netty Starring  and Dr. Lysle Pearl for hiatal hernia.  She reports that she has chronic dyspepsia, she reports indigestion and easy bloating.  She also reports intermittent heartburn.  She recently also endorses dyspnea on exertion.  This prompted a chest x-ray that have personally reviewed showing evidence of a hiatal hernia.  There is no other further imaging studies.  She does have a history of chronic cough and COPD. She is competent and is very independent.  She is able to do all her ADLs.  Her last BMP and CBC was completely normal as well as a hemoglobin A1c.  Part of her work-up included an echocardiogram showing preserved ejection fraction. She does uses Advair but no oxygen. She had a history of abdominal hysterectomy several years ago.   HPI  Past Medical History:  Diagnosis Date   Anxiety    COPD (chronic obstructive pulmonary disease) (HCC)    GERD (gastroesophageal reflux disease)    OCD (obsessive compulsive disorder)     Past Surgical History:  Procedure Laterality Date   ABDOMINAL HYSTERECTOMY     BREAST BIOPSY Left 2015   Negative-Fibrocystic changes   COLONOSCOPY WITH PROPOFOL N/A 05/19/2018   Procedure: COLONOSCOPY WITH PROPOFOL;  Surgeon: Lollie Sails, MD;  Location: [facility] ENDOSCOPY;  Service: Endoscopy;  Laterality: N/A;   OOPHORECTOMY     TONSILLECTOMY      Family History  Problem Relation Age of Onset   Breast cancer Mother 72   Coronary artery disease Mother    Stroke Mother    Uterine cancer Mother    Vaginal cancer Mother    Colon cancer Father    Lung cancer Father     Social History Social History   Tobacco Use   Smoking status: Never   Smokeless tobacco: Never  Substance Use Topics   Alcohol use: Yes    Alcohol/week: 1.0 standard drink    Types: 1 Glasses of wine per week   Drug  use: Never    Allergies  Allergen Reactions   Advair Diskus [Fluticasone-Salmeterol]    Codeine    Prednisone     Current Outpatient Medications  Medication Sig Dispense Refill   aspirin EC 81 MG tablet Take 81 mg by mouth daily.     calcium carbonate (OSCAL) 1500 (600 Ca) MG TABS tablet Take 1,500 mg by mouth 2 (two) times daily with a meal.     magnesium oxide (MAG-OX) 400 MG tablet Take 400 mg by mouth daily.     Melatonin CR 3 MG TBCR Take 1 tablet by mouth at bedtime.     montelukast (SINGULAIR) 10 MG tablet Take 10 mg by mouth at bedtime.     omeprazole (PRILOSEC) 20 MG capsule Take 20 mg by mouth daily.     potassium chloride (K-DUR) 10 MEQ tablet Take 10 mEq by mouth daily.     escitalopram (LEXAPRO) 5 MG tablet Take 5 mg by mouth daily. (Patient not taking: Reported on 05/28/2021)     No current facility-administered medications for this visit.     Review of Systems Full ROS  was asked and was negative except for the information on the HPI  Physical Exam Blood pressure 127/84, pulse 78, temperature 99 F (37.2 C), height 5\' 2"  (1.575 m), weight 174 lb (78.9 kg), SpO2  97 %. CONSTITUTIONAL: NAD EYES: Pupils are equal, round,  Sclera are non-icteric. EARS, NOSE, MOUTH AND THROAT: She is wearing a mask hearing is intact to voice. LYMPH NODES:  Lymph nodes in the neck are normal. RESPIRATORY:  Lungs are clear. There is normal respiratory effort, with equal breath sounds bilaterally, and without pathologic use of accessory muscles. CARDIOVASCULAR: Heart is regular without murmurs, gallops, or rubs. GI: The abdomen is soft, nontender, and nondistended. There are no palpable masses. There is no hepatosplenomegaly. There are normal bowel sounds in all quadrants.  Lower midline laparotomy scar GU: Rectal deferred.   MUSCULOSKELETAL: Normal muscle strength and tone. No cyanosis or edema.   SKIN: Turgor is good and there are no pathologic skin lesions or ulcers. NEUROLOGIC: Motor  and sensation is grossly normal. Cranial nerves are grossly intact. PSYCH:  Oriented to person, place and time. Affect is normal.  Data Reviewed  I have personally reviewed the patient's imaging, laboratory findings and medical records.    Assessment/Plan  80 year old with a hiatal hernia that seems to be symptomatic.  We will start work-up.  She does have COPD and we will need to delineate the mediastinal and intra-abdominal anatomy.  We will obtain a CT scan of the chest abdomen pelvis.  We will also get a swallow study to evaluate the function of the stomach.  And she already has a referral for GI but the latest they can see her was in third week of September.  I have contacted Dr. Lysle Pearl who is the surgeon who referred her and also performs endoscopies and he will be able to perform an upper endoscopy within the next few weeks.  She was very grateful that we were able to accommodate her request. Once we complete her work-up we will see her back and discuss further options.  Today we talked about surgical options to repair her hiatal hernia.  Postoperative course and outcomes.  I do think that this time is too early to tell whether or not repair is indicated.  I would like to complete the work-up before any final decision is made. She was very Patent attorney. Please note that I spent over 60 minutes in this encounter including personally reviewing the images coordinating her care and providing extensive counseling as well as performing appropriate documentation   Caroleen Hamman, MD FACS General Surgeon 05/29/2021, 7:46 AM

## 2021-06-04 ENCOUNTER — Other Ambulatory Visit
Admission: RE | Admit: 2021-06-04 | Discharge: 2021-06-04 | Disposition: A | Discharge status: Home / Self Care | Payer: Medicare Other | Source: Ambulatory Visit | Attending: Surgery | Admitting: Surgery

## 2021-06-04 ENCOUNTER — Ambulatory Visit: Payer: Medicare Other

## 2021-06-04 ENCOUNTER — Other Ambulatory Visit: Payer: Self-pay

## 2021-06-04 ENCOUNTER — Ambulatory Visit
Admission: RE | Admit: 2021-06-04 | Discharge: 2021-06-04 | Disposition: A | Discharge status: Home / Self Care | Payer: Medicare Other | Source: Ambulatory Visit | Attending: Surgery | Admitting: Surgery

## 2021-06-04 ENCOUNTER — Telehealth: Payer: Self-pay

## 2021-06-04 DIAGNOSIS — K449 Diaphragmatic hernia without obstruction or gangrene: Secondary | ICD-10-CM | POA: Diagnosis not present

## 2021-06-04 LAB — CBC WITH DIFFERENTIAL/PLATELET
Abs Immature Granulocytes: 0.02 10*3/uL (ref 0.00–0.07)
Basophils Absolute: 0.1 10*3/uL (ref 0.0–0.1)
Basophils Relative: 1 %
Eosinophils Absolute: 0.1 10*3/uL (ref 0.0–0.5)
Eosinophils Relative: 2 %
HCT: 40 % (ref 36.0–46.0)
Hemoglobin: 13.6 g/dL (ref 12.0–15.0)
Immature Granulocytes: 0 %
Lymphocytes Relative: 24 %
Lymphs Abs: 1.8 10*3/uL (ref 0.7–4.0)
MCH: 29.2 pg (ref 26.0–34.0)
MCHC: 34 g/dL (ref 30.0–36.0)
MCV: 85.8 fL (ref 80.0–100.0)
Monocytes Absolute: 0.7 10*3/uL (ref 0.1–1.0)
Monocytes Relative: 9 %
Neutro Abs: 4.9 10*3/uL (ref 1.7–7.7)
Neutrophils Relative %: 64 %
Platelets: 294 10*3/uL (ref 150–400)
RBC: 4.66 MIL/uL (ref 3.87–5.11)
RDW: 13.8 % (ref 11.5–15.5)
WBC: 7.7 10*3/uL (ref 4.0–10.5)
nRBC: 0 % (ref 0.0–0.2)

## 2021-06-04 LAB — COMPREHENSIVE METABOLIC PANEL
ALT: 12 U/L (ref 0–44)
AST: 20 U/L (ref 15–41)
Albumin: 4 g/dL (ref 3.5–5.0)
Alkaline Phosphatase: 64 U/L (ref 38–126)
Anion gap: 10 (ref 5–15)
BUN: 17 mg/dL (ref 8–23)
CO2: 26 mmol/L (ref 22–32)
Calcium: 9.4 mg/dL (ref 8.9–10.3)
Chloride: 100 mmol/L (ref 98–111)
Creatinine, Ser: 0.75 mg/dL (ref 0.44–1.00)
GFR, Estimated: >60 mL/min (ref >60)
Glucose, Bld: 102 mg/dL — ABNORMAL HIGH (ref 70–99)
Potassium: 3.7 mmol/L (ref 3.5–5.1)
Sodium: 136 mmol/L (ref 135–145)
Total Bilirubin: 0.6 mg/dL (ref 0.3–1.2)
Total Protein: 7.2 g/dL (ref 6.5–8.1)

## 2021-06-04 NOTE — Telephone Encounter (Signed)
Spoke with patient\s blood work normal. Patient is currently having the barium swallow done.

## 2021-06-12 ENCOUNTER — Other Ambulatory Visit: Payer: Self-pay

## 2021-06-12 ENCOUNTER — Ambulatory Visit
Admission: RE | Admit: 2021-06-12 | Discharge: 2021-06-12 | Disposition: A | Discharge status: Home / Self Care | Payer: Medicare Other | Source: Ambulatory Visit | Attending: Surgery | Admitting: Surgery

## 2021-06-12 DIAGNOSIS — K449 Diaphragmatic hernia without obstruction or gangrene: Secondary | ICD-10-CM | POA: Diagnosis not present

## 2021-06-12 MED ORDER — IOHEXOL 350 MG/ML SOLN
100.0000 mL | Freq: Once | INTRAVENOUS | Status: AC | PRN
  Administered 2021-06-12: 100 mL via INTRAVENOUS

## 2021-06-13 ENCOUNTER — Other Ambulatory Visit: Payer: Self-pay

## 2021-06-13 DIAGNOSIS — R918 Other nonspecific abnormal finding of lung field: Secondary | ICD-10-CM

## 2021-06-16 ENCOUNTER — Other Ambulatory Visit: Payer: Self-pay

## 2021-06-16 DIAGNOSIS — R918 Other nonspecific abnormal finding of lung field: Secondary | ICD-10-CM

## 2021-06-17 DIAGNOSIS — J984 Other disorders of lung: Secondary | ICD-10-CM | POA: Insufficient documentation

## 2021-06-19 ENCOUNTER — Encounter: Admission: RE | Payer: Self-pay | Source: Home / Self Care

## 2021-06-19 ENCOUNTER — Ambulatory Visit: Admission: RE | Admit: 2021-06-19 | Payer: Medicare Other | Source: Home / Self Care | Admitting: Surgery

## 2021-06-19 SURGERY — EGD (ESOPHAGOGASTRODUODENOSCOPY)
Anesthesia: General

## 2021-06-24 ENCOUNTER — Ambulatory Visit: Payer: Medicare Other

## 2021-06-25 ENCOUNTER — Ambulatory Visit: Payer: Medicare Other | Admitting: Surgery

## 2021-06-27 ENCOUNTER — Ambulatory Visit: Payer: Medicare Other

## 2021-07-02 ENCOUNTER — Ambulatory Visit
Admission: RE | Admit: 2021-07-02 | Discharge: 2021-07-02 | Disposition: A | Discharge status: Home / Self Care | Payer: Medicare Other | Source: Ambulatory Visit | Attending: Surgery | Admitting: Surgery

## 2021-07-02 ENCOUNTER — Other Ambulatory Visit: Payer: Self-pay

## 2021-07-02 ENCOUNTER — Ambulatory Visit: Payer: Medicare Other | Admitting: Surgery

## 2021-07-02 DIAGNOSIS — R918 Other nonspecific abnormal finding of lung field: Secondary | ICD-10-CM | POA: Insufficient documentation

## 2021-07-02 DIAGNOSIS — M5136 Other intervertebral disc degeneration, lumbar region: Secondary | ICD-10-CM | POA: Diagnosis not present

## 2021-07-02 DIAGNOSIS — K5730 Diverticulosis of large intestine without perforation or abscess without bleeding: Secondary | ICD-10-CM | POA: Insufficient documentation

## 2021-07-02 DIAGNOSIS — N200 Calculus of kidney: Secondary | ICD-10-CM | POA: Diagnosis not present

## 2021-07-02 DIAGNOSIS — I2510 Atherosclerotic heart disease of native coronary artery without angina pectoris: Secondary | ICD-10-CM | POA: Insufficient documentation

## 2021-07-02 DIAGNOSIS — K449 Diaphragmatic hernia without obstruction or gangrene: Secondary | ICD-10-CM | POA: Diagnosis not present

## 2021-07-02 LAB — GLUCOSE, CAPILLARY: Glucose-Capillary: 104 mg/dL — ABNORMAL HIGH (ref 70–99)

## 2021-07-02 MED ORDER — FLUDEOXYGLUCOSE F - 18 (FDG) INJECTION
9.1600 | Freq: Once | INTRAVENOUS | Status: AC | PRN
  Administered 2021-07-02: 9.16 via INTRAVENOUS

## 2021-07-09 ENCOUNTER — Ambulatory Visit (INDEPENDENT_AMBULATORY_CARE_PROVIDER_SITE_OTHER): Payer: Medicare Other | Admitting: Surgery

## 2021-07-09 ENCOUNTER — Other Ambulatory Visit: Payer: Self-pay

## 2021-07-09 ENCOUNTER — Encounter: Payer: Self-pay | Admitting: Surgery

## 2021-07-09 VITALS — BP 146/89 | HR 87 | Temp 98.4°F | Ht 61.5 in | Wt 176.8 lb

## 2021-07-09 DIAGNOSIS — K219 Gastro-esophageal reflux disease without esophagitis: Secondary | ICD-10-CM | POA: Insufficient documentation

## 2021-07-09 DIAGNOSIS — R911 Solitary pulmonary nodule: Secondary | ICD-10-CM | POA: Diagnosis not present

## 2021-07-09 DIAGNOSIS — F419 Anxiety disorder, unspecified: Secondary | ICD-10-CM | POA: Insufficient documentation

## 2021-07-09 DIAGNOSIS — J45998 Other asthma: Secondary | ICD-10-CM | POA: Insufficient documentation

## 2021-07-09 NOTE — Patient Instructions (Signed)
Referral sent to [facility]. Someone from their office will call you to schedule an appointment  within 7-10 days. If you do not hear from their office please call our office and let us know so we can check on this. Their information is listed below.   [facility] [id number], [place], [place] [address] Phone: [phone]  Please call our office when you decide to move forward with the hiatal hernia.

## 2021-07-11 ENCOUNTER — Encounter: Payer: Self-pay | Admitting: Surgery

## 2021-07-11 NOTE — Progress Notes (Signed)
Outpatient Surgical Follow Up  07/11/2021  Gail Salinas is an 80 y.o. female.   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  Further work-up including a CT scan of the chest that I personally reviewed confirming the large hernia but also with a pulmonary nodule concerning for primary neoplasm.  The nodule is located in the left upper lobe.  She underwent PET/CT without evidence of distant metastatic disease.  I had had an extensive discussion with her regarding CT and PET findings.  She is very concerned about potential malignancy.  I explained to her different options to include close follow-up in 6 months with a repeat CT versus biopsy versus resection.  She wishes to proceed with potential resection if possible.  Past Medical History:  Diagnosis Date   Anxiety    COPD (chronic obstructive pulmonary disease) (HCC)    GERD (gastroesophageal reflux disease)    OCD (obsessive compulsive disorder)     Past Surgical History:  Procedure Laterality Date   ABDOMINAL HYSTERECTOMY     BREAST BIOPSY Left 2015   Negative-Fibrocystic changes   COLONOSCOPY WITH PROPOFOL N/A 05/19/2018   Procedure: COLONOSCOPY WITH PROPOFOL;  Surgeon: Lollie Sails, MD;  Location: [facility] ENDOSCOPY;  Service: Endoscopy;  Laterality: N/A;   OOPHORECTOMY     TONSILLECTOMY      Family History  Problem Relation Age of Onset   Breast cancer Mother 46   Coronary artery disease Mother    Stroke Mother    Uterine cancer Mother    Vaginal cancer Mother    Colon cancer Father    Lung cancer Father     Social History:  reports that she has never smoked. She has never used smokeless tobacco. She reports current alcohol use of about 1.0 standard drink per week. She reports that she does not use drugs.  Allergies:  Allergies  Allergen  Reactions   Advair Diskus [Fluticasone-Salmeterol]    Codeine    Prednisone     Medications reviewed.    ROS Full ROS performed and is otherwise negative other than what is stated in HPI   BP (!) 146/89   Pulse 87   Temp 98.4 F (36.9 C) (Oral)   Ht 5' 1.5" (1.562 m)   Wt 176 lb 12.8 oz (80.2 kg)   SpO2 96%   BMI 32.87 kg/m   Physical Exam Vitals and nursing note reviewed.  Constitutional:      General: She is not in acute distress.    Appearance: Normal appearance.  Cardiovascular:     Rate and Rhythm: Normal rate and regular rhythm.     Heart sounds: No murmur heard. Pulmonary:     Effort: Pulmonary effort is normal.     Breath sounds: Normal breath sounds.  Abdominal:     General: Abdomen is flat. There is no distension.     Palpations: Abdomen is soft. There is no mass.     Tenderness: There is no abdominal tenderness.     Hernia: No hernia is present.  Musculoskeletal:     Cervical back: Normal range of motion and neck supple. No rigidity or tenderness.  Lymphadenopathy:     Cervical: No cervical adenopathy.  Skin:    General: Skin is warm and dry.     Capillary Refill:  Capillary refill takes less than 2 seconds.  Neurological:     General: No focal deficit present.     Mental Status: She is alert and oriented to person, place, and time.  Psychiatric:        Mood and Affect: Mood normal.        Behavior: Behavior normal.        Thought Content: Thought content normal.        Judgment: Judgment normal.       Assessment/Plan: Gail Salinas is a 80 year old female well-known to me with a history of symptomatic paraesophageal hernia upon completion of work-up there was evidence of left upper lobe lesion that was concerning for potential malignancy.  PET/CT did not show any evidence of metastatic or mediastinal disease however there was medium uptake within the lesion.  I had an extensive discussion with the patient and the family and she is very concerned about  the possibility of this being carcinoma.  I offered her options of potential biopsy versus follow-up CT versus referral to thoracic surgery and for potential wedge versus lobectomy for both therapeutic and diagnostic purposes.  She is adamant that she is concerned and she does not want to take any chances.  She wishes for me to do a referral to thoracic surgery.  We will hold any interventions from the paraesophageal hernia until the pulmonary issue is addressed.  I will see her back in 6 months.   Greater than 50% of the 45 minutes  visit was spent in counseling/coordination of care   Caroleen Hamman, MD [place] Surgeon

## 2021-07-15 ENCOUNTER — Telehealth: Payer: Self-pay

## 2021-07-15 NOTE — Telephone Encounter (Signed)
Called and spoke with patient to get her scheduled for consult appt with Dr. Valeta Harms. She is now scheduled. Nothing further needed.  Next Appt With Pulmonology Octavio Graves Icard, DO)07/21/2021 at 11:30 AM

## 2021-07-15 NOTE — Telephone Encounter (Signed)
-----   Message from Garner Nash, DO sent at 07/15/2021  1:28 PM EDT ----- Regarding: FW: needs appt  Please schedule with me in a available nodule slot. Whenever is fine  Thanks BLI ----- Message ----- From: Laury Deep, RN Sent: 07/10/2021  11:45 AM EDT To: Oleta Mouse, DO, #  HL/Icard: please review and let us know who the right MD the patient needs to see please!  Thanks, Ryan ----- Message ----- From: Ned Clines Sent: 07/10/2021  11:43 AM EDT To: Laury Deep, RN  Lung nodule, [id number], PET 8/31, CT Chest 8/11, where do you want me to add this?  thanks

## 2021-07-21 ENCOUNTER — Other Ambulatory Visit: Payer: Self-pay

## 2021-07-21 ENCOUNTER — Ambulatory Visit (INDEPENDENT_AMBULATORY_CARE_PROVIDER_SITE_OTHER): Payer: Medicare Other | Admitting: Pulmonary Disease

## 2021-07-21 ENCOUNTER — Encounter: Payer: Self-pay | Admitting: Pulmonary Disease

## 2021-07-21 VITALS — BP 122/78 | HR 82 | Ht 61.5 in | Wt 178.8 lb

## 2021-07-21 DIAGNOSIS — R918 Other nonspecific abnormal finding of lung field: Secondary | ICD-10-CM | POA: Diagnosis not present

## 2021-07-21 DIAGNOSIS — Z789 Other specified health status: Secondary | ICD-10-CM | POA: Diagnosis not present

## 2021-07-21 DIAGNOSIS — R911 Solitary pulmonary nodule: Secondary | ICD-10-CM

## 2021-07-21 DIAGNOSIS — J984 Other disorders of lung: Secondary | ICD-10-CM

## 2021-07-21 NOTE — Patient Instructions (Addendum)
Thank you for visiting Dr. Valeta Harms at [facility]. Today we recommend the following:  Orders Placed This Encounter  Procedures   Pulmonary Function Test   Please schedule PFTS ASAP   Return in about 8 weeks (around 09/15/2021) for with APP or Dr. Valeta Harms.    Please do your part to reduce the spread of COVID-19.

## 2021-07-21 NOTE — Progress Notes (Signed)
Synopsis: Referred in September 2022 for left lower lobe lung nodule by Dion Body, MD  Subjective:   PATIENT ID: Gail Salinas GENDER: female DOB: 1941-04-30, MRN: [id number]  Chief Complaint  Patient presents with   Consult    Referred for lung nodule found on recent CT scan. Denies any current respiratory issues.     This is a 80 year old female, non-smoker, gastroesophageal reflux, hiatal hernia.  Patient had work-up for shortness of breath which included a CT scan of the chest which found an incidental left lower lobe superior segment part solid, groundglass nodule 17 mm x 10 mm in size.  Patient has no family history of lung cancer.  Father with colon cancer.  Mother with history of breast cancer.  Medical history states father with lung cancer but after further delineation and sounds like his colon cancer reoccurred with metastasis to the lung.  From respiratory standpoint she is doing well.  She has not had pulmonary function tests.   Past Medical History:  Diagnosis Date   Anxiety    COPD (chronic obstructive pulmonary disease) (HCC)    GERD (gastroesophageal reflux disease)    OCD (obsessive compulsive disorder)      Family History  Problem Relation Age of Onset   Breast cancer Mother 82   Coronary artery disease Mother    Stroke Mother    Uterine cancer Mother    Vaginal cancer Mother    Colon cancer Father    Lung cancer Father      Past Surgical History:  Procedure Laterality Date   ABDOMINAL HYSTERECTOMY     BREAST BIOPSY Left 2015   Negative-Fibrocystic changes   COLONOSCOPY WITH PROPOFOL N/A 05/19/2018   Procedure: COLONOSCOPY WITH PROPOFOL;  Surgeon: Lollie Sails, MD;  Location: [facility] ENDOSCOPY;  Service: Endoscopy;  Laterality: N/A;   OOPHORECTOMY     TONSILLECTOMY      Social History   Socioeconomic History   Marital status: Married    Spouse name: Not on file   Number of children: Not on file   Years of education: Not on file    Highest education level: Not on file  Occupational History   Not on file  Tobacco Use   Smoking status: Never   Smokeless tobacco: Never  Substance and Sexual Activity   Alcohol use: Yes    Alcohol/week: 1.0 standard drink    Types: 1 Glasses of wine per week   Drug use: Never   Sexual activity: Not on file  Other Topics Concern   Not on file  Social History Narrative   Not on file   Social Determinants of Health   Financial Resource Strain: Not on file  Food Insecurity: Not on file  Transportation Needs: Not on file  Physical Activity: Not on file  Stress: Not on file  Social Connections: Not on file  Intimate Partner Violence: Not on file     Allergies  Allergen Reactions   Advair Diskus [Fluticasone-Salmeterol]    Codeine    Prednisone      Outpatient Medications Prior to Visit  Medication Sig Dispense Refill   aspirin EC 81 MG tablet Take 81 mg by mouth daily.     calcium carbonate (OSCAL) 1500 (600 Ca) MG TABS tablet Take 1,500 mg by mouth 2 (two) times daily with a meal.     DULoxetine (CYMBALTA) 30 MG capsule Take 30 mg by mouth daily.     magnesium gluconate (MAGONATE) 500 MG tablet Take 500  mg by mouth 2 (two) times daily.     montelukast (SINGULAIR) 10 MG tablet Take 10 mg by mouth at bedtime.     omeprazole (PRILOSEC) 20 MG capsule Take 20 mg by mouth daily.     potassium chloride SA (KLOR-CON) 20 MEQ tablet Take 20 mEq by mouth 4 (four) times daily.     magnesium oxide (MAG-OX) 400 MG tablet Take 400 mg by mouth daily.     potassium chloride (K-DUR) 10 MEQ tablet Take 10 mEq by mouth daily.     No facility-administered medications prior to visit.    Review of Systems  Constitutional:  Negative for chills, fever, malaise/fatigue and weight loss.  HENT:  Negative for hearing loss, sore throat and tinnitus.   Eyes:  Negative for blurred vision and double vision.  Respiratory:  Negative for cough, hemoptysis, sputum production, shortness of breath,  wheezing and stridor.   Cardiovascular:  Negative for chest pain, palpitations, orthopnea, leg swelling and PND.  Gastrointestinal:  Negative for abdominal pain, constipation, diarrhea, heartburn, nausea and vomiting.  Genitourinary:  Negative for dysuria, hematuria and urgency.  Musculoskeletal:  Negative for joint pain and myalgias.  Skin:  Negative for itching and rash.  Neurological:  Negative for dizziness, tingling, weakness and headaches.  Endo/Heme/Allergies:  Negative for environmental allergies. Does not bruise/bleed easily.  Psychiatric/Behavioral:  Negative for depression. The patient is not nervous/anxious and does not have insomnia.   All other systems reviewed and are negative.   Objective:  Physical Exam Vitals reviewed.  Constitutional:      General: She is not in acute distress.    Appearance: She is well-developed.  HENT:     Head: Normocephalic and atraumatic.  Eyes:     General: No scleral icterus.    Conjunctiva/sclera: Conjunctivae normal.     Pupils: Pupils are equal, round, and reactive to light.  Neck:     Vascular: No JVD.     Trachea: No tracheal deviation.  Cardiovascular:     Rate and Rhythm: Normal rate and regular rhythm.     Heart sounds: Normal heart sounds. No murmur heard. Pulmonary:     Effort: Pulmonary effort is normal. No tachypnea, accessory muscle usage or respiratory distress.     Breath sounds: No stridor. No wheezing, rhonchi or rales.  Abdominal:     General: Bowel sounds are normal. There is no distension.     Palpations: Abdomen is soft.     Tenderness: There is no abdominal tenderness.  Musculoskeletal:        General: No tenderness.     Cervical back: Neck supple.  Lymphadenopathy:     Cervical: No cervical adenopathy.  Skin:    General: Skin is warm and dry.     Capillary Refill: Capillary refill takes less than 2 seconds.     Findings: No rash.  Neurological:     Mental Status: She is alert and oriented to person,  place, and time.  Psychiatric:        Behavior: Behavior normal.     Vitals:   07/21/21 1129  BP: 122/78  Pulse: 82  SpO2: 99%  Weight: 178 lb 12.8 oz (81.1 kg)  Height: 5' 1.5" (1.562 m)   99% on RA BMI Readings from Last 3 Encounters:  07/21/21 33.24 kg/m  07/09/21 32.87 kg/m  05/28/21 31.83 kg/m   Wt Readings from Last 3 Encounters:  07/21/21 178 lb 12.8 oz (81.1 kg)  07/09/21 176 lb 12.8 oz (80.2 kg)  05/28/21 174 lb (78.9 kg)     CBC    Component Value Date/Time   WBC 7.7 06/04/2021 1048   RBC 4.66 06/04/2021 1048   HGB 13.6 06/04/2021 1048   HGB 11.6 (L) 01/17/2014 0519   HCT 40.0 06/04/2021 1048   HCT 34.1 (L) 01/17/2014 0519   PLT 294 06/04/2021 1048   PLT 315 01/17/2014 0519   MCV 85.8 06/04/2021 1048   MCV 84 01/17/2014 0519   MCH 29.2 06/04/2021 1048   MCHC 34.0 06/04/2021 1048   RDW 13.8 06/04/2021 1048   RDW 14.6 (H) 01/17/2014 0519   LYMPHSABS 1.8 06/04/2021 1048   LYMPHSABS 1.0 01/17/2014 0519   MONOABS 0.7 06/04/2021 1048   MONOABS 0.6 01/17/2014 0519   EOSABS 0.1 06/04/2021 1048   EOSABS 0.0 01/17/2014 0519   BASOSABS 0.1 06/04/2021 1048   BASOSABS 0.0 01/17/2014 0519    Chest Imaging: 06/12/2021 CT chest groundglass subsolid 17 x 10 mm nodule within the left lower lobe superior segment The patient's images have been independently reviewed by me.   07/02/2021 nuclear medicine PET scan: Low-level uptake within left lower lobe lesion. The patient's images have been independently reviewed by me.    Pulmonary Functions Testing Results: No flowsheet data found.  FeNO:   Pathology:   Echocardiogram:   Heart Catheterization:     Assessment & Plan:     ICD-10-CM   1. Pulmonary lesion  J98.4 Pulmonary Function Test    2. Lung nodule  R91.1 Pulmonary Function Test    3. Ground glass opacity present on imaging of lung  R91.8 Pulmonary Function Test    4. Non-smoker  Z78.9       Discussion:  This is a 80 year old female  non-smoker found to have an incidental subsolid 17 mm x 10 mm, subsolid part solid groundglass lesion. We discussed the risk of malignancy development.  Brock calculator nodule for a part solid lesion reveals malignancy probability of 33%.  We discussed potential etiologies other than malignancy.  We also discussed next best steps.  Plan: We discussed conservative follow-up imaging in 3 to 6 months versus consideration for tissue biopsy. She seems to be rather anxious about the lesion understandably. We also discussed possibilities to include combination procedure with thoracic surgery to include robotic assisted bronchoscopy and tissue sampling.  If tissue sampling in the room was consistent with malignancy that she would be lined up for a single anesthetic event and surgical removal the same day. I think that the location of the lesion is actually rather difficult to get into being so high up in the superior segment.  It may be better even to just consider combination case for attempt at tissue biopsy and dye marking and move directly to wedge resection if we were unable to obtain a diagnosis. She has a hiatal hernia that will also need to be addressed at some point after we address the lung lesion. I will reach out to Dr. Kipp Brood to discuss case. We will have PFTs complete before surgical consultation.   Current Outpatient Medications:    aspirin EC 81 MG tablet, Take 81 mg by mouth daily., Disp: , Rfl:    calcium carbonate (OSCAL) 1500 (600 Ca) MG TABS tablet, Take 1,500 mg by mouth 2 (two) times daily with a meal., Disp: , Rfl:    DULoxetine (CYMBALTA) 30 MG capsule, Take 30 mg by mouth daily., Disp: , Rfl:    magnesium gluconate (MAGONATE) 500 MG tablet, Take 500 mg by  mouth 2 (two) times daily., Disp: , Rfl:    montelukast (SINGULAIR) 10 MG tablet, Take 10 mg by mouth at bedtime., Disp: , Rfl:    omeprazole (PRILOSEC) 20 MG capsule, Take 20 mg by mouth daily., Disp: , Rfl:    potassium  chloride SA (KLOR-CON) 20 MEQ tablet, Take 20 mEq by mouth 4 (four) times daily., Disp: , Rfl:   I spent 62 minutes dedicated to the care of this patient on the date of this encounter to include pre-visit review of records, face-to-face time with the patient discussing conditions above, post visit ordering of testing, clinical documentation with the electronic health record, making appropriate referrals as documented, and communicating necessary findings to members of the patients care team.   Garner Nash, DO [facility] 07/21/2021 12:17 PM

## 2021-08-06 ENCOUNTER — Ambulatory Visit (INDEPENDENT_AMBULATORY_CARE_PROVIDER_SITE_OTHER): Payer: Medicare Other | Admitting: Pulmonary Disease

## 2021-08-06 ENCOUNTER — Other Ambulatory Visit: Payer: Self-pay

## 2021-08-06 DIAGNOSIS — R911 Solitary pulmonary nodule: Secondary | ICD-10-CM

## 2021-08-06 DIAGNOSIS — R918 Other nonspecific abnormal finding of lung field: Secondary | ICD-10-CM

## 2021-08-06 DIAGNOSIS — J984 Other disorders of lung: Secondary | ICD-10-CM

## 2021-08-06 LAB — PULMONARY FUNCTION TEST
DL/VA % pred: 93 %
DL/VA: 3.84 ml/min/mmHg/L
DLCO cor % pred: 77 %
DLCO cor: 14.22 ml/min/mmHg
DLCO unc % pred: 77 %
DLCO unc: 14.22 ml/min/mmHg
FEF 25-75 Pre: 1.62 L/sec
FEF2575-%Pred-Pre: 111 %
FEV1-%Pred-Pre: 92 %
FEV1-Pre: 1.78 L
FEV1FVC-%Pred-Pre: 106 %
FEV6-%Pred-Pre: 92 %
FEV6-Pre: 2.24 L
FEV6FVC-%Pred-Pre: 105 %
FVC-%Pred-Pre: 87 %
FVC-Pre: 2.26 L
Pre FEV1/FVC ratio: 79 %
Pre FEV6/FVC Ratio: 100 %
RV % pred: 86 %
RV: 1.99 L
TLC % pred: 88 %
TLC: 4.35 L

## 2021-08-06 NOTE — Progress Notes (Signed)
PFT done today. 

## 2021-08-15 ENCOUNTER — Other Ambulatory Visit: Payer: Self-pay

## 2021-08-15 ENCOUNTER — Institutional Professional Consult (permissible substitution) (INDEPENDENT_AMBULATORY_CARE_PROVIDER_SITE_OTHER): Payer: Medicare Other | Admitting: Thoracic Surgery (Cardiothoracic Vascular Surgery)

## 2021-08-15 VITALS — BP 164/78 | HR 88 | Resp 20 | Ht 61.0 in | Wt 180.0 lb

## 2021-08-15 DIAGNOSIS — K449 Diaphragmatic hernia without obstruction or gangrene: Secondary | ICD-10-CM | POA: Diagnosis not present

## 2021-08-15 DIAGNOSIS — R911 Solitary pulmonary nodule: Secondary | ICD-10-CM | POA: Diagnosis not present

## 2021-08-15 NOTE — Progress Notes (Signed)
[address]       [place] [address]             [phone]                    Yusra C Bores [place] Medical Record #[id number] Date of Birth: 1940-11-29  Referring: Garner Nash, DO Primary Care: Dion Body, MD Primary Cardiologist: None  Chief Complaint:    Chief Complaint  Patient presents with   Consult    Initial surgical consult, hiatal hernia PET 8/31, ct chest 8/11 pft 10/5    History of Present Illness:    NALAYAH HITT 80 y.o. female referred by Dr. Valeta Harms for surgical evaluation of a left upper lobe pulmonary nodule that was found incidentally as well as a large hiatal hernia.  This was found incidentally.  She underwent a chest x-ray for shortness of breath and was noted to have a hiatal hernia.  On evaluation by the surgeon a CT chest was ordered which then identified the pulmonary nodule.  She is a lifelong non-smoker however she has been exposed to secondhand smoke.  She does admit to some fatigue and some shortness of breath.  But denies any neurologic symptoms.  She also admits to some dysphagia to liquids.  She denies any odynophagia.       Zubrod Score: At the time of surgery this patient's most appropriate activity status/level should be described as: [x]     0    Normal activity, no symptoms []     1    Restricted in physical strenuous activity but ambulatory, able to do out light work []     2    Ambulatory and capable of self care, unable to do work activities, up and about               >50 % of waking hours                              []     3    Only limited self care, in bed greater than 50% of waking hours []     4    Completely disabled, no self care, confined to bed or chair []     5    Moribund   Past Medical History:  Diagnosis Date   Anxiety    COPD (chronic obstructive pulmonary disease) (HCC)    GERD (gastroesophageal reflux disease)    OCD (obsessive compulsive disorder)     Past Surgical  History:  Procedure Laterality Date   ABDOMINAL HYSTERECTOMY     BREAST BIOPSY Left 2015   Negative-Fibrocystic changes   COLONOSCOPY WITH PROPOFOL N/A 05/19/2018   Procedure: COLONOSCOPY WITH PROPOFOL;  Surgeon: Lollie Sails, MD;  Location: [facility] ENDOSCOPY;  Service: Endoscopy;  Laterality: N/A;   OOPHORECTOMY     TONSILLECTOMY      Family History  Problem Relation Age of Onset   Breast cancer Mother 25   Coronary artery disease Mother    Stroke Mother    Uterine cancer Mother    Vaginal cancer Mother    Colon cancer Father    Lung cancer Father      Social History   Tobacco Use  Smoking Status Never  Smokeless Tobacco Never    Social History   Substance and Sexual Activity  Alcohol Use Yes   Alcohol/week: 1.0 standard drink  Types: 1 Glasses of wine per week     Allergies  Allergen Reactions   Advair Diskus [Fluticasone-Salmeterol]    Codeine    Prednisone     Current Outpatient Medications  Medication Sig Dispense Refill   aspirin EC 81 MG tablet Take 81 mg by mouth daily.     calcium carbonate (OSCAL) 1500 (600 Ca) MG TABS tablet Take 1,500 mg by mouth 2 (two) times daily with a meal.     DULoxetine (CYMBALTA) 30 MG capsule Take 30 mg by mouth daily.     magnesium gluconate (MAGONATE) 500 MG tablet Take 500 mg by mouth 2 (two) times daily.     montelukast (SINGULAIR) 10 MG tablet Take 10 mg by mouth at bedtime.     omeprazole (PRILOSEC) 20 MG capsule Take 20 mg by mouth daily.     potassium chloride SA (KLOR-CON) 20 MEQ tablet Take 20 mEq by mouth 4 (four) times daily.     No current facility-administered medications for this visit.    Review of Systems  Constitutional:  Positive for malaise/fatigue. Negative for weight loss.  Respiratory:  Positive for shortness of breath.   Cardiovascular:  Negative for chest pain.  Gastrointestinal:  Positive for heartburn and nausea.    PHYSICAL EXAMINATION: BP (!) 164/78 (BP Location: Right Arm, Patient  Position: Sitting)   Pulse 88   Resp 20   Ht 5\' 1"  (1.549 m)   Wt 180 lb (81.6 kg)   SpO2 98% Comment: RA  BMI 34.01 kg/m  Physical Exam Constitutional:      General: She is not in acute distress.    Appearance: Normal appearance. She is normal weight. She is not ill-appearing.  HENT:     Head: Normocephalic and atraumatic.  Eyes:     Extraocular Movements: Extraocular movements intact.  Cardiovascular:     Rate and Rhythm: Normal rate and regular rhythm.     Heart sounds: No murmur heard. Pulmonary:     Effort: Pulmonary effort is normal. No respiratory distress.     Breath sounds: Normal breath sounds.  Abdominal:     General: Abdomen is flat.    Musculoskeletal:        General: Normal range of motion.  Skin:    General: Skin is warm and dry.  Neurological:     General: No focal deficit present.     Mental Status: She is alert and oriented to person, place, and time.    Diagnostic Studies & Laboratory data:     Recent Radiology Findings:   No results found.     I have independently reviewed the above radiology studies  and reviewed the findings with the patient.   Recent Lab Findings: Lab Results  Component Value Date   WBC 7.7 06/04/2021   HGB 13.6 06/04/2021   HCT 40.0 06/04/2021   PLT 294 06/04/2021   GLUCOSE 102 (H) 06/04/2021   ALT 12 06/04/2021   AST 20 06/04/2021   NA 136 06/04/2021   K 3.7 06/04/2021   CL 100 06/04/2021   CREATININE 0.75 06/04/2021   BUN 17 06/04/2021   CO2 26 06/04/2021     PFTs: - FVC: 87% - FEV1: 92% -DLCO: 77%  Problem List: 1.7 cm left upper lobe pulmonary nodule Large paraesophageal hernia  Assessment / Plan:   This is a 80 year old female who was noted to have an incidental 1.7 cm left upper lobe pulmonary nodule as well as a large paraesophageal hernia.  I personally  reviewed the CT scan and PET/CT.  She does not have significant uptake in the pulmonary nodule.  Based on its appearance and size it is somewhat  concerning for primary lung cancer.  We discussed the risks and benefits of a combination procedure with Dr. Valeta Harms which will include navigational bronchoscopy with biopsy and marking followed by a robotic assisted left upper lobe wedge resection and possible lobectomy.  Regardless of the bronchoscopic biopsy results the patient states that she would like this nodule removed.  I will meet with Dr. Valeta Harms to discuss timing.  In regards to the paraesophageal hernia she is symptomatic from, and I think that is also contributing to her shortness of breath.  We will address this following recovered from her lung surgery.     I  spent 40 minutes with  the patient face to face in counseling and coordination of care.    Lajuana Matte 08/15/2021 4:30 PM

## 2021-08-19 ENCOUNTER — Encounter: Payer: Self-pay | Admitting: *Deleted

## 2021-08-19 ENCOUNTER — Other Ambulatory Visit: Payer: Self-pay | Admitting: *Deleted

## 2021-08-19 DIAGNOSIS — R911 Solitary pulmonary nodule: Secondary | ICD-10-CM | POA: Diagnosis present

## 2021-08-22 ENCOUNTER — Telehealth: Payer: Self-pay | Admitting: Pulmonary Disease

## 2021-08-22 DIAGNOSIS — R911 Solitary pulmonary nodule: Secondary | ICD-10-CM

## 2021-08-22 NOTE — Telephone Encounter (Signed)
-----   Message from Laury Deep, RN sent at 08/19/2021 10:29 AM EDT ----- Regarding: RE: Marykay Lex,   She is all set for surgery on Thursday 11/17 first case.  You can do ENB at 7:30am and we will follow in OR 10.  I will schedule her pre-op appt/COVID swab for Tuesday 11/15.  Thanks, Thurmond Butts ----- Message ----- From: Garner Nash, DO Sent: 08/18/2021   4:47 PM EDT To: Laury Deep, RN, Lajuana Matte, MD Subject: RE:                                            How far out?   Nov 11th (any time)  Nov 17th - 730am start   Brad ----- Message ----- From: Laury Deep, RN Sent: 08/15/2021   4:41 PM EDT To: Garner Nash, DO, Lajuana Matte, MD  Dr. Valeta Harms: send me several date options and I will see what is available.  Thanks, Ryan ----- Message ----- From: Lajuana Matte, MD Sent: 08/15/2021   4:40 PM EDT To: Laury Deep, RN, Garner Nash, DO  Hey, I saw this lady in clinic.  Thanks for sending her.  She is agreeable to a combination procedure.  Regardless of the biopsy results she wants nodule out, so we can probably just do a marking followed by a wedge possible lobe.

## 2021-08-22 NOTE — Telephone Encounter (Signed)
Planned RAB + RATS on 11/17 Orders placed   Garner Nash, DO [facility] 08/22/2021 5:42 PM

## 2021-08-25 ENCOUNTER — Telehealth: Payer: Self-pay | Admitting: Pulmonary Disease

## 2021-08-25 ENCOUNTER — Encounter: Payer: Self-pay | Admitting: Pulmonary Disease

## 2021-08-25 NOTE — Telephone Encounter (Signed)
The following has been scheduled & pt has been made aware:  Surgeon:  Dr. Leory Plowman Icard  Chest CT: 09/04/21 @ 2:00 PM - check in by 1:45 PM      Location:  [place] [facility].      COVID Test:  09/16/21 at 8:30 AM    Location:   [facility], [place], first office on right when entering the   building  Procedure: Bronchoscopy                          Procedure date:  09/18/2021 at 7:30 AM - checking in by 5:30 AM  Location: [place] not eat anything after midnight - ok to take medications on the day of procedure with only sips of water You will need someone to drive you home after this procedure

## 2021-09-04 ENCOUNTER — Ambulatory Visit (HOSPITAL_COMMUNITY)
Admission: RE | Admit: 2021-09-04 | Discharge: 2021-09-04 | Disposition: A | Discharge status: Home / Self Care | Payer: Medicare Other | Source: Ambulatory Visit | Attending: Pulmonary Disease | Admitting: Pulmonary Disease

## 2021-09-04 ENCOUNTER — Encounter (HOSPITAL_COMMUNITY): Payer: Self-pay

## 2021-09-04 ENCOUNTER — Other Ambulatory Visit: Payer: Self-pay

## 2021-09-04 DIAGNOSIS — R911 Solitary pulmonary nodule: Secondary | ICD-10-CM | POA: Insufficient documentation

## 2021-09-15 ENCOUNTER — Ambulatory Visit: Payer: Medicare Other | Admitting: Pulmonary Disease

## 2021-09-15 NOTE — Progress Notes (Signed)
Surgical Instructions    Your procedure is scheduled on Thursday, November 17th, 2022.   Report to [facility] Main Entrance "A" at 05:30 A.M., then check in with the Admitting office.  Call this number if you have problems the morning of surgery:  [phone]   If you have any questions prior to your surgery date call [phone]: Open Monday-Friday 8am-4pm    Remember:  Do not eat or drink after midnight the night before your surgery    Take these medicines the morning of surgery with A SIP OF WATER:  DULoxetine (CYMBALTA) omeprazole (PRILOSEC)    Follow your surgeon's instructions on when to stop Aspirin.  If no instructions were given by your surgeon then you will need to call the office to get those instructions.     As of today, STOP taking any Aspirin (unless otherwise instructed by your surgeon) Aleve, Naproxen, Ibuprofen, Motrin, Advil, Goody's, BC's, all herbal medications, fish oil, and all vitamins.  After your COVID test   You are not required to quarantine however you are required to wear a well-fitting mask when you are out and around people not in your household.  If your mask becomes wet or soiled, replace with a new one.  Wash your hands often with soap and water for 20 seconds or clean your hands with an alcohol-based hand sanitizer that contains at least 60% alcohol.  Do not share personal items.  Notify your provider: if you are in close contact with someone who has COVID  or if you develop a fever of 100.4 or greater, sneezing, cough, sore throat, shortness of breath or body aches.   The day of surgery:          Do not wear jewelry or makeup Do not wear lotions, powders, perfumes, or deodorant. Do not shave 48 hours prior to surgery.   Do not bring valuables to the hospital. DO Not wear nail polish, gel polish, artificial nails, or any other type of covering on natural nails including finger and toenails. If patients have artificial nails, gel  coating, etc. that need to be removed by a nail salon, please have this removed prior to surgery or surgery may need to be canceled/delayed if the surgeon/ anesthesia feels like the patient is unable to be adequately monitored.              [facility] is not responsible for any belongings or valuables.  Do NOT Smoke (Tobacco/Vaping)  24 hours prior to your procedure  If you use a CPAP at night, you may bring your mask for your overnight stay.   Contacts, glasses, hearing aids, dentures or partials may not be worn into surgery, please bring cases for these belongings   For patients admitted to the hospital, discharge time will be determined by your treatment team.   Patients discharged the day of surgery will not be allowed to drive home, and someone needs to stay with them for 24 hours.  NO VISITORS WILL BE ALLOWED IN PRE-OP WHERE PATIENTS ARE PREPPED FOR SURGERY.  ONLY 1 SUPPORT PERSON MAY BE PRESENT IN THE WAITING ROOM WHILE YOU ARE IN SURGERY.  IF YOU ARE TO BE ADMITTED, ONCE YOU ARE IN YOUR ROOM YOU WILL BE ALLOWED TWO (2) VISITORS. 1 (ONE) VISITOR MAY STAY OVERNIGHT BUT MUST ARRIVE TO THE ROOM BY 8pm.  Minor children may have two parents present. Special consideration for safety and communication needs will be reviewed on a case by case basis.  Special instructions:    Oral Hygiene is also important to reduce your risk of infection.  Remember - BRUSH YOUR TEETH THE MORNING OF SURGERY WITH YOUR REGULAR TOOTHPASTE   [facility]- Preparing For Surgery  Before surgery, you can play an important role. Because skin is not sterile, your skin needs to be as free of germs as possible. You can reduce the number of germs on your skin by washing with CHG (chlorahexidine gluconate) Soap before surgery.  CHG is an antiseptic cleaner which kills germs and bonds with the skin to continue killing germs even after washing.     Please do not use if you have an allergy to CHG or antibacterial soaps. If  your skin becomes reddened/irritated stop using the CHG.  Do not shave (including legs and underarms) for at least 48 hours prior to first CHG shower. It is OK to shave your face.  Please follow these instructions carefully.     Shower the NIGHT BEFORE SURGERY and the MORNING OF SURGERY with CHG Soap.   If you chose to wash your hair, wash your hair first as usual with your normal shampoo. After you shampoo, rinse your hair and body thoroughly to remove the shampoo.  Then [employer] and genitals (private parts) with your normal soap and rinse thoroughly to remove soap.  After that Use CHG Soap as you would any other liquid soap. You can apply CHG directly to the skin and wash gently with a scrungie or a clean washcloth.   Apply the CHG Soap to your body ONLY FROM THE NECK DOWN.  Do not use on open wounds or open sores. Avoid contact with your eyes, ears, mouth and genitals (private parts). Wash Face and genitals (private parts)  with your normal soap.   Wash thoroughly, paying special attention to the area where your surgery will be performed.  Thoroughly rinse your body with warm water from the neck down.  DO NOT shower/wash with your normal soap after using and rinsing off the CHG Soap.  Pat yourself dry with a CLEAN TOWEL.  Wear CLEAN PAJAMAS to bed the night before surgery  Place CLEAN SHEETS on your bed the night before your surgery  DO NOT SLEEP WITH PETS.   Day of Surgery:  Take a shower with CHG soap. Wear Clean/Comfortable clothing the morning of surgery Do not apply any deodorants/lotions.   Remember to brush your teeth WITH YOUR REGULAR TOOTHPASTE.   Please read over the following fact sheets that you were given.

## 2021-09-16 ENCOUNTER — Encounter (HOSPITAL_COMMUNITY)
Admission: RE | Admit: 2021-09-16 | Discharge: 2021-09-16 | Disposition: A | Discharge status: Home / Self Care | Payer: Medicare Other | Source: Ambulatory Visit | Attending: Thoracic Surgery (Cardiothoracic Vascular Surgery) | Admitting: Thoracic Surgery (Cardiothoracic Vascular Surgery)

## 2021-09-16 ENCOUNTER — Encounter (HOSPITAL_COMMUNITY): Payer: Self-pay

## 2021-09-16 ENCOUNTER — Other Ambulatory Visit: Payer: Medicare Other

## 2021-09-16 ENCOUNTER — Ambulatory Visit (HOSPITAL_COMMUNITY)
Admission: RE | Admit: 2021-09-16 | Discharge: 2021-09-16 | Disposition: A | Discharge status: Home / Self Care | Payer: Medicare Other | Source: Ambulatory Visit | Attending: Thoracic Surgery (Cardiothoracic Vascular Surgery) | Admitting: Thoracic Surgery (Cardiothoracic Vascular Surgery)

## 2021-09-16 ENCOUNTER — Other Ambulatory Visit: Payer: Self-pay

## 2021-09-16 VITALS — BP 137/74 | HR 80 | Temp 98.3°F | Resp 18 | Ht 62.0 in | Wt 181.4 lb

## 2021-09-16 DIAGNOSIS — Z20822 Contact with and (suspected) exposure to covid-19: Secondary | ICD-10-CM | POA: Insufficient documentation

## 2021-09-16 DIAGNOSIS — Z01818 Encounter for other preprocedural examination: Secondary | ICD-10-CM | POA: Insufficient documentation

## 2021-09-16 DIAGNOSIS — Z7982 Long term (current) use of aspirin: Secondary | ICD-10-CM | POA: Diagnosis not present

## 2021-09-16 DIAGNOSIS — R911 Solitary pulmonary nodule: Secondary | ICD-10-CM

## 2021-09-16 DIAGNOSIS — R7303 Prediabetes: Secondary | ICD-10-CM

## 2021-09-16 DIAGNOSIS — K449 Diaphragmatic hernia without obstruction or gangrene: Secondary | ICD-10-CM | POA: Insufficient documentation

## 2021-09-16 HISTORY — DX: Prediabetes: R73.03

## 2021-09-16 LAB — BLOOD GAS, ARTERIAL
Acid-base deficit: 0.8 mmol/L (ref 0.0–2.0)
Bicarbonate: 22.7 mmol/L (ref 20.0–28.0)
Drawn by: 602861
FIO2: 21
O2 Saturation: 98.4 %
Patient temperature: 37
pCO2 arterial: 33.1 mmHg (ref 32.0–48.0)
pH, Arterial: 7.451 — ABNORMAL HIGH (ref 7.350–7.450)
pO2, Arterial: 113 mmHg — ABNORMAL HIGH (ref 83.0–108.0)

## 2021-09-16 LAB — CBC
HCT: 40.2 % (ref 36.0–46.0)
Hemoglobin: 13.4 g/dL (ref 12.0–15.0)
MCH: 29.1 pg (ref 26.0–34.0)
MCHC: 33.3 g/dL (ref 30.0–36.0)
MCV: 87.4 fL (ref 80.0–100.0)
Platelets: 299 10*3/uL (ref 150–400)
RBC: 4.6 MIL/uL (ref 3.87–5.11)
RDW: 13.7 % (ref 11.5–15.5)
WBC: 8.9 10*3/uL (ref 4.0–10.5)
nRBC: 0 % (ref 0.0–0.2)

## 2021-09-16 LAB — COMPREHENSIVE METABOLIC PANEL
ALT: 11 U/L (ref 0–44)
AST: 18 U/L (ref 15–41)
Albumin: 3.8 g/dL (ref 3.5–5.0)
Alkaline Phosphatase: 57 U/L (ref 38–126)
Anion gap: 10 (ref 5–15)
BUN: 20 mg/dL (ref 8–23)
CO2: 21 mmol/L — ABNORMAL LOW (ref 22–32)
Calcium: 9.2 mg/dL (ref 8.9–10.3)
Chloride: 104 mmol/L (ref 98–111)
Creatinine, Ser: 0.8 mg/dL (ref 0.44–1.00)
GFR, Estimated: >60 mL/min (ref >60)
Glucose, Bld: 93 mg/dL (ref 70–99)
Potassium: 4.3 mmol/L (ref 3.5–5.1)
Sodium: 135 mmol/L (ref 135–145)
Total Bilirubin: 0.5 mg/dL (ref 0.3–1.2)
Total Protein: 6.7 g/dL (ref 6.5–8.1)

## 2021-09-16 LAB — HEMOGLOBIN A1C
Hgb A1c MFr Bld: 5.4 % (ref 4.8–5.6)
Mean Plasma Glucose: 108.28 mg/dL

## 2021-09-16 LAB — URINALYSIS, ROUTINE W REFLEX MICROSCOPIC
Bilirubin Urine: NEGATIVE
Glucose, UA: NEGATIVE mg/dL
Hgb urine dipstick: NEGATIVE
Ketones, ur: NEGATIVE mg/dL
Leukocytes,Ua: NEGATIVE
Nitrite: NEGATIVE
Protein, ur: NEGATIVE mg/dL
Specific Gravity, Urine: 1.014 (ref 1.005–1.030)
pH: 8 (ref 5.0–8.0)

## 2021-09-16 LAB — SARS CORONAVIRUS 2 (TAT 6-24 HRS): SARS Coronavirus 2: NEGATIVE

## 2021-09-16 LAB — PROTIME-INR
INR: 0.9 (ref 0.8–1.2)
Prothrombin Time: 11.9 seconds (ref 11.4–15.2)

## 2021-09-16 LAB — SURGICAL PCR SCREEN
MRSA, PCR: NEGATIVE
Staphylococcus aureus: NEGATIVE

## 2021-09-16 LAB — TYPE AND SCREEN
ABO/RH(D): A POS
Antibody Screen: NEGATIVE

## 2021-09-16 LAB — GLUCOSE, CAPILLARY: Glucose-Capillary: 104 mg/dL — ABNORMAL HIGH (ref 70–99)

## 2021-09-16 LAB — APTT: aPTT: 23 seconds — ABNORMAL LOW (ref 24–36)

## 2021-09-16 NOTE — Progress Notes (Signed)
PCP - Dr. 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 She does not check her CBG at home  Blood Thinner Instructions: n/a Aspirin Instructions: pt instructed to hold ASA unless instructed otherwise. If no instructions given, contact surgeon  ERAS Protcol - no, NPO   COVID TEST- 09/16/21 at PAT   Anesthesia review: no  Patient denies shortness of breath, fever, cough and chest pain at PAT appointment   All instructions explained to the patient, with a verbal understanding of the material. Patient agrees to go over the instructions while at home for a better understanding. Patient also instructed to wear a mask in public after being tested for COVID-19. The opportunity to ask questions was provided.

## 2021-09-17 NOTE — Anesthesia Preprocedure Evaluation (Addendum)
Anesthesia Evaluation  Patient identified by MRN, date of birth, ID band Patient awake    Reviewed: Allergy & Precautions, H&P , NPO status , Patient's Chart, lab work & pertinent test results, Unable to perform ROS - Chart review only  History of Anesthesia Complications (+) PONV and history of anesthetic complications  Airway Mallampati: IV  TM Distance: >3 FB Neck ROM: Full    Dental  (+) Chipped, Poor Dentition, Caps   Pulmonary neg shortness of breath, COPD, neg recent URI,    breath sounds clear to auscultation       Cardiovascular Exercise Tolerance: Good (-) angina(-) Past MI and (-) DOE negative cardio ROS   Rhythm:Regular     Neuro/Psych PSYCHIATRIC DISORDERS Anxiety negative neurological ROS     GI/Hepatic Neg liver ROS, hiatal hernia, GERD  Medicated and Controlled,  Endo/Other  negative endocrine ROS  Renal/GU negative Renal ROSLab Results      Component                Value               Date                      CREATININE               0.80                09/16/2021             negative genitourinary   Musculoskeletal   Abdominal   Peds  Hematology negative hematology ROS (+) Lab Results      Component                Value               Date                      WBC                      8.9                 09/16/2021                HGB                      13.4                09/16/2021                HCT                      40.2                09/16/2021                MCV                      87.4                09/16/2021                PLT                      299                 09/16/2021  Anesthesia Other Findings Past Medical History: No date: Anxiety No date: COPD (chronic obstructive pulmonary disease) (HCC) No date: GERD (gastroesophageal reflux disease) No date: OCD (obsessive compulsive disorder)  Past Surgical History: No date: ABDOMINAL HYSTERECTOMY 2015: BREAST  BIOPSY; Left     Comment:  Negative No date: TONSILLECTOMY  BMI    Body Mass Index:  33.46 kg/m      Reproductive/Obstetrics negative OB ROS                            Anesthesia Physical  Anesthesia Plan  ASA: 3  Anesthesia Plan: General   Post-op Pain Management:    Induction: Intravenous  PONV Risk Score and Plan: 3 and Ondansetron, Dexamethasone, Treatment may vary due to age or medical condition, Propofol infusion and TIVA  Airway Management Planned: Oral ETT and Double Lumen EBT  Additional Equipment:   Intra-op Plan:   Post-operative Plan: Extubation in OR  Informed Consent: I have reviewed the patients History and Physical, chart, labs and discussed the procedure including the risks, benefits and alternatives for the proposed anesthesia with the patient or authorized representative who has indicated his/her understanding and acceptance.     History available from chart only and Dental advisory given  Plan Discussed with: CRNA and Anesthesiologist  Anesthesia Plan Comments: ([place])       Anesthesia Quick Evaluation

## 2021-09-18 ENCOUNTER — Inpatient Hospital Stay (HOSPITAL_COMMUNITY): Payer: Medicare Other

## 2021-09-18 ENCOUNTER — Encounter (HOSPITAL_COMMUNITY)
Admission: AD | Disposition: A | Discharge status: Home / Self Care | Payer: Self-pay | Source: Home / Self Care | Attending: Thoracic Surgery (Cardiothoracic Vascular Surgery)

## 2021-09-18 ENCOUNTER — Observation Stay (HOSPITAL_COMMUNITY)
Admission: AD | Admit: 2021-09-18 | Discharge: 2021-09-20 | Disposition: A | Discharge status: Home / Self Care | Payer: Medicare Other | Attending: Thoracic Surgery (Cardiothoracic Vascular Surgery) | Admitting: Thoracic Surgery (Cardiothoracic Vascular Surgery)

## 2021-09-18 ENCOUNTER — Ambulatory Visit (HOSPITAL_COMMUNITY): Payer: Medicare Other | Admitting: Anesthesiology

## 2021-09-18 ENCOUNTER — Other Ambulatory Visit: Payer: Self-pay

## 2021-09-18 ENCOUNTER — Ambulatory Visit (HOSPITAL_COMMUNITY): Payer: Medicare Other

## 2021-09-18 ENCOUNTER — Encounter (HOSPITAL_COMMUNITY): Payer: Self-pay | Admitting: Pulmonary Disease

## 2021-09-18 DIAGNOSIS — Z419 Encounter for procedure for purposes other than remedying health state, unspecified: Secondary | ICD-10-CM

## 2021-09-18 DIAGNOSIS — R911 Solitary pulmonary nodule: Secondary | ICD-10-CM

## 2021-09-18 DIAGNOSIS — C801 Malignant (primary) neoplasm, unspecified: Secondary | ICD-10-CM

## 2021-09-18 DIAGNOSIS — Z7982 Long term (current) use of aspirin: Secondary | ICD-10-CM | POA: Diagnosis not present

## 2021-09-18 DIAGNOSIS — C3432 Malignant neoplasm of lower lobe, left bronchus or lung: Principal | ICD-10-CM | POA: Insufficient documentation

## 2021-09-18 DIAGNOSIS — J449 Chronic obstructive pulmonary disease, unspecified: Secondary | ICD-10-CM | POA: Insufficient documentation

## 2021-09-18 DIAGNOSIS — J939 Pneumothorax, unspecified: Secondary | ICD-10-CM

## 2021-09-18 HISTORY — PX: BRONCHIAL BRUSHINGS: SHX5108

## 2021-09-18 HISTORY — DX: Other specified postprocedural states: Z98.890

## 2021-09-18 HISTORY — PX: VIDEO BRONCHOSCOPY WITH ENDOBRONCHIAL NAVIGATION: SHX6175

## 2021-09-18 HISTORY — PX: LYMPH NODE DISSECTION: SHX5087

## 2021-09-18 HISTORY — DX: Malignant (primary) neoplasm, unspecified: C80.1

## 2021-09-18 HISTORY — PX: LOBECTOMY: SHX5089

## 2021-09-18 HISTORY — PX: FIDUCIAL MARKER PLACEMENT: SHX6858

## 2021-09-18 HISTORY — PX: INTERCOSTAL NERVE BLOCK: SHX5021

## 2021-09-18 HISTORY — PX: BRONCHIAL NEEDLE ASPIRATION BIOPSY: SHX5106

## 2021-09-18 HISTORY — DX: Nausea with vomiting, unspecified: R11.2

## 2021-09-18 LAB — GLUCOSE, CAPILLARY: Glucose-Capillary: 107 mg/dL — ABNORMAL HIGH (ref 70–99)

## 2021-09-18 LAB — ABO/RH: ABO/RH(D): A POS

## 2021-09-18 SURGERY — WEDGE RESECTION, LUNG, ROBOT-ASSISTED, THORACOSCOPIC
Anesthesia: General | Site: Chest | Laterality: Left

## 2021-09-18 SURGERY — VIDEO BRONCHOSCOPY WITH ENDOBRONCHIAL NAVIGATION
Anesthesia: General | Laterality: Left

## 2021-09-18 MED ORDER — LIDOCAINE 2% (20 MG/ML) 5 ML SYRINGE
INTRAMUSCULAR | Status: DC | PRN
  Administered 2021-09-18: 60 mg via INTRAVENOUS

## 2021-09-18 MED ORDER — FENTANYL CITRATE (PF) 250 MCG/5ML IJ SOLN
INTRAMUSCULAR | Status: AC
  Filled 2021-09-18: qty 5

## 2021-09-18 MED ORDER — DULOXETINE HCL 30 MG PO CPEP
30.0000 mg | ORAL_CAPSULE | Freq: Every day | ORAL | Status: DC
  Administered 2021-09-19 – 2021-09-20 (×2): 30 mg via ORAL
  Filled 2021-09-18 (×2): qty 1

## 2021-09-18 MED ORDER — METHYLENE BLUE 0.5 % INJ SOLN
INTRAVENOUS | Status: DC | PRN
  Administered 2021-09-18: 08:00:00 1 mL

## 2021-09-18 MED ORDER — BUPIVACAINE LIPOSOME 1.3 % IJ SUSP
INTRAMUSCULAR | Status: AC
  Filled 2021-09-18: qty 20

## 2021-09-18 MED ORDER — ACETAMINOPHEN 10 MG/ML IV SOLN
INTRAVENOUS | Status: AC
  Filled 2021-09-18: qty 100

## 2021-09-18 MED ORDER — ASPIRIN EC 81 MG PO TBEC
81.0000 mg | DELAYED_RELEASE_TABLET | Freq: Every day | ORAL | Status: DC
  Administered 2021-09-19 – 2021-09-20 (×2): 81 mg via ORAL
  Filled 2021-09-18 (×2): qty 1

## 2021-09-18 MED ORDER — ACETAMINOPHEN 500 MG PO TABS
1000.0000 mg | ORAL_TABLET | Freq: Four times a day (QID) | ORAL | Status: DC
  Administered 2021-09-18 – 2021-09-20 (×7): 1000 mg via ORAL
  Filled 2021-09-18 (×8): qty 2

## 2021-09-18 MED ORDER — ROCURONIUM BROMIDE 100 MG/10ML IV SOLN
INTRAVENOUS | Status: DC | PRN
  Administered 2021-09-18 (×2): 50 mg via INTRAVENOUS

## 2021-09-18 MED ORDER — PROPOFOL 10 MG/ML IV BOLUS
INTRAVENOUS | Status: AC
  Filled 2021-09-18: qty 20

## 2021-09-18 MED ORDER — CHLORHEXIDINE GLUCONATE 0.12 % MT SOLN
15.0000 mL | Freq: Once | OROMUCOSAL | Status: AC
  Administered 2021-09-18: 06:00:00 15 mL via OROMUCOSAL
  Filled 2021-09-18: qty 15

## 2021-09-18 MED ORDER — KETOROLAC TROMETHAMINE 15 MG/ML IJ SOLN
15.0000 mg | Freq: Four times a day (QID) | INTRAMUSCULAR | Status: AC
  Administered 2021-09-18 – 2021-09-20 (×8): 15 mg via INTRAVENOUS
  Filled 2021-09-18 (×8): qty 1

## 2021-09-18 MED ORDER — ACETAMINOPHEN 10 MG/ML IV SOLN
INTRAVENOUS | Status: DC | PRN
  Administered 2021-09-18: 1000 mg via INTRAVENOUS

## 2021-09-18 MED ORDER — LACTATED RINGERS IV SOLN: INTRAVENOUS | Status: DC | PRN

## 2021-09-18 MED ORDER — ENOXAPARIN SODIUM 40 MG/0.4ML IJ SOSY
40.0000 mg | PREFILLED_SYRINGE | INTRAMUSCULAR | Status: DC
  Administered 2021-09-19: 40 mg via SUBCUTANEOUS
  Filled 2021-09-18: qty 0.4

## 2021-09-18 MED ORDER — BUPIVACAINE LIPOSOME 1.3 % IJ SUSP
INTRAMUSCULAR | Status: DC | PRN
  Administered 2021-09-18: 09:00:00 50 mL

## 2021-09-18 MED ORDER — FENTANYL CITRATE (PF) 250 MCG/5ML IJ SOLN
INTRAMUSCULAR | Status: DC | PRN
  Administered 2021-09-18 (×2): 100 ug via INTRAVENOUS
  Administered 2021-09-18 (×2): 50 ug via INTRAVENOUS

## 2021-09-18 MED ORDER — MORPHINE SULFATE (PF) 2 MG/ML IV SOLN: 1.0000 mg | INTRAVENOUS | Status: DC | PRN

## 2021-09-18 MED ORDER — DROPERIDOL 2.5 MG/ML IJ SOLN
0.6250 mg | Freq: Once | INTRAMUSCULAR | Status: AC | PRN
  Administered 2021-09-18: 11:00:00 0.625 mg via INTRAVENOUS

## 2021-09-18 MED ORDER — ONDANSETRON HCL 4 MG/2ML IJ SOLN
INTRAMUSCULAR | Status: DC | PRN
  Administered 2021-09-18: 4 mg via INTRAVENOUS

## 2021-09-18 MED ORDER — PROPOFOL 500 MG/50ML IV EMUL
INTRAVENOUS | Status: DC | PRN
  Administered 2021-09-18: 100 ug/kg/min via INTRAVENOUS

## 2021-09-18 MED ORDER — ACETAMINOPHEN 160 MG/5ML PO SOLN: 1000.0000 mg | Freq: Four times a day (QID) | ORAL | Status: DC

## 2021-09-18 MED ORDER — PROMETHAZINE HCL 25 MG/ML IJ SOLN
INTRAMUSCULAR | Status: AC
  Filled 2021-09-18: qty 1

## 2021-09-18 MED ORDER — BISACODYL 5 MG PO TBEC
10.0000 mg | DELAYED_RELEASE_TABLET | Freq: Every day | ORAL | Status: DC
  Administered 2021-09-18 – 2021-09-20 (×2): 10 mg via ORAL
  Filled 2021-09-18 (×3): qty 2

## 2021-09-18 MED ORDER — LACTATED RINGERS IV SOLN: INTRAVENOUS | Status: DC

## 2021-09-18 MED ORDER — KETOROLAC TROMETHAMINE 15 MG/ML IJ SOLN
INTRAMUSCULAR | Status: AC
  Filled 2021-09-18: qty 1

## 2021-09-18 MED ORDER — PROPOFOL 10 MG/ML IV BOLUS
INTRAVENOUS | Status: DC | PRN
  Administered 2021-09-18: 200 mg via INTRAVENOUS

## 2021-09-18 MED ORDER — BUPIVACAINE HCL (PF) 0.5 % IJ SOLN
INTRAMUSCULAR | Status: AC
  Filled 2021-09-18: qty 30

## 2021-09-18 MED ORDER — HEMOSTATIC AGENTS (NO CHARGE) OPTIME
TOPICAL | Status: DC | PRN
  Administered 2021-09-18: 1 via TOPICAL

## 2021-09-18 MED ORDER — SUGAMMADEX SODIUM 200 MG/2ML IV SOLN
INTRAVENOUS | Status: DC | PRN
  Administered 2021-09-18 (×2): 100 mg via INTRAVENOUS

## 2021-09-18 MED ORDER — SENNOSIDES-DOCUSATE SODIUM 8.6-50 MG PO TABS
1.0000 | ORAL_TABLET | Freq: Every day | ORAL | Status: DC
  Administered 2021-09-18 – 2021-09-19 (×2): 1 via ORAL
  Filled 2021-09-18 (×2): qty 1

## 2021-09-18 MED ORDER — PHENYLEPHRINE HCL-NACL 20-0.9 MG/250ML-% IV SOLN
INTRAVENOUS | Status: DC | PRN
  Administered 2021-09-18: 30 ug/min via INTRAVENOUS

## 2021-09-18 MED ORDER — EPHEDRINE SULFATE-NACL 50-0.9 MG/10ML-% IV SOSY
PREFILLED_SYRINGE | INTRAVENOUS | Status: DC | PRN
  Administered 2021-09-18: 15 mg via INTRAVENOUS

## 2021-09-18 MED ORDER — DROPERIDOL 2.5 MG/ML IJ SOLN
INTRAMUSCULAR | Status: AC
  Filled 2021-09-18: qty 2

## 2021-09-18 MED ORDER — PHENYLEPHRINE 40 MCG/ML (10ML) SYRINGE FOR IV PUSH (FOR BLOOD PRESSURE SUPPORT)
PREFILLED_SYRINGE | INTRAVENOUS | Status: DC | PRN
  Administered 2021-09-18 (×4): 80 ug via INTRAVENOUS

## 2021-09-18 MED ORDER — TRAMADOL HCL 50 MG PO TABS
50.0000 mg | ORAL_TABLET | Freq: Four times a day (QID) | ORAL | Status: DC | PRN
  Administered 2021-09-19 (×2): 100 mg via ORAL
  Filled 2021-09-18 (×2): qty 2

## 2021-09-18 MED ORDER — MONTELUKAST SODIUM 10 MG PO TABS
10.0000 mg | ORAL_TABLET | Freq: Every day | ORAL | Status: DC
  Administered 2021-09-18 – 2021-09-19 (×2): 10 mg via ORAL
  Filled 2021-09-18 (×2): qty 1

## 2021-09-18 MED ORDER — SUCCINYLCHOLINE CHLORIDE 200 MG/10ML IV SOSY
PREFILLED_SYRINGE | INTRAVENOUS | Status: DC | PRN
  Administered 2021-09-18: 120 mg via INTRAVENOUS

## 2021-09-18 MED ORDER — PROMETHAZINE HCL 25 MG/ML IJ SOLN
6.2500 mg | INTRAMUSCULAR | Status: DC | PRN
  Administered 2021-09-18: 13:00:00 6.25 mg via INTRAVENOUS

## 2021-09-18 MED ORDER — FENTANYL CITRATE (PF) 100 MCG/2ML IJ SOLN: 25.0000 ug | INTRAMUSCULAR | Status: DC | PRN

## 2021-09-18 MED ORDER — CEFAZOLIN SODIUM-DEXTROSE 2-4 GM/100ML-% IV SOLN
2.0000 g | INTRAVENOUS | Status: AC
  Administered 2021-09-18: 08:00:00 2 g via INTRAVENOUS
  Filled 2021-09-18: qty 100

## 2021-09-18 MED ORDER — ONDANSETRON HCL 4 MG/2ML IJ SOLN: 4.0000 mg | Freq: Four times a day (QID) | INTRAMUSCULAR | Status: DC | PRN

## 2021-09-18 MED ORDER — PANTOPRAZOLE SODIUM 40 MG PO TBEC
40.0000 mg | DELAYED_RELEASE_TABLET | Freq: Every day | ORAL | Status: DC
  Administered 2021-09-18 – 2021-09-20 (×3): 40 mg via ORAL
  Filled 2021-09-18 (×3): qty 1

## 2021-09-18 MED ORDER — ORAL CARE MOUTH RINSE: 15.0000 mL | Freq: Once | OROMUCOSAL | Status: AC

## 2021-09-18 MED ORDER — DEXAMETHASONE SODIUM PHOSPHATE 10 MG/ML IJ SOLN
INTRAMUSCULAR | Status: DC | PRN
  Administered 2021-09-18: 10 mg via INTRAVENOUS

## 2021-09-18 SURGICAL SUPPLY — 105 items
BLADE CLIPPER SURG (BLADE) ×3 IMPLANT
BNDG COHESIVE 6X5 TAN STRL LF (GAUZE/BANDAGES/DRESSINGS) ×3 IMPLANT
CANISTER SUCT 3000ML PPV (MISCELLANEOUS) ×6 IMPLANT
CANNULA REDUC XI 12-8 STAPL (CANNULA) ×2
CANNULA REDUCER 12-8 DVNC XI (CANNULA) ×4 IMPLANT
CATH THORACIC 28FR (CATHETERS) ×3 IMPLANT
CATH TROCAR 20FR (CATHETERS) IMPLANT
CHLORAPREP W/TINT 26 (MISCELLANEOUS) ×3 IMPLANT
CLIP VESOCCLUDE MED 6/CT (CLIP) IMPLANT
CNTNR URN SCR LID CUP LEK RST (MISCELLANEOUS) ×4 IMPLANT
CONN ST 1/4X3/8  BEN (MISCELLANEOUS)
CONN ST 1/4X3/8 BEN (MISCELLANEOUS) IMPLANT
CONT SPEC 4OZ STRL OR WHT (MISCELLANEOUS) ×2
DEFOGGER SCOPE WARMER CLEARIFY (MISCELLANEOUS) ×3 IMPLANT
DERMABOND ADVANCED (GAUZE/BANDAGES/DRESSINGS) ×1
DERMABOND ADVANCED .7 DNX12 (GAUZE/BANDAGES/DRESSINGS) ×2 IMPLANT
DRAIN CHANNEL 28F RND 3/8 FF (WOUND CARE) IMPLANT
DRAIN CHANNEL 32F RND 10.7 FF (WOUND CARE) IMPLANT
DRAPE ARM DVNC X/XI (DISPOSABLE) ×8 IMPLANT
DRAPE COLUMN DVNC XI (DISPOSABLE) ×2 IMPLANT
DRAPE CV SPLIT W-CLR ANES SCRN (DRAPES) ×3 IMPLANT
DRAPE DA VINCI XI ARM (DISPOSABLE) ×4
DRAPE DA VINCI XI COLUMN (DISPOSABLE) ×1
DRAPE HALF SHEET 40X57 (DRAPES) ×3 IMPLANT
DRAPE ORTHO SPLIT 77X108 STRL (DRAPES) ×1
DRAPE SURG ORHT 6 SPLT 77X108 (DRAPES) ×2 IMPLANT
ELECT BLADE 6.5 EXT (BLADE) IMPLANT
ELECT REM PT RETURN 9FT ADLT (ELECTROSURGICAL) ×3
ELECTRODE REM PT RTRN 9FT ADLT (ELECTROSURGICAL) ×2 IMPLANT
GAUZE KITTNER 4X5 RF (MISCELLANEOUS) ×3 IMPLANT
GAUZE SPONGE 4X4 12PLY STRL (GAUZE/BANDAGES/DRESSINGS) ×3 IMPLANT
GLOVE SURG ENC MOIS LTX SZ7.5 (GLOVE) ×6 IMPLANT
GOWN STRL REUS W/ TWL LRG LVL3 (GOWN DISPOSABLE) ×4 IMPLANT
GOWN STRL REUS W/ TWL XL LVL3 (GOWN DISPOSABLE) ×6 IMPLANT
GOWN STRL REUS W/TWL 2XL LVL3 (GOWN DISPOSABLE) ×3 IMPLANT
GOWN STRL REUS W/TWL LRG LVL3 (GOWN DISPOSABLE) ×2
GOWN STRL REUS W/TWL XL LVL3 (GOWN DISPOSABLE) ×3
HEMOSTAT SURGICEL 2X14 (HEMOSTASIS) ×3 IMPLANT
KIT BASIN OR (CUSTOM PROCEDURE TRAY) ×3 IMPLANT
KIT SUCTION CATH 14FR (SUCTIONS) IMPLANT
KIT TURNOVER KIT B (KITS) ×3 IMPLANT
LOOP VESSEL SUPERMAXI WHITE (MISCELLANEOUS) IMPLANT
NEEDLE 22X1 1/2 (OR ONLY) (NEEDLE) ×3 IMPLANT
NEEDLE HYPO 25GX1X1/2 BEV (NEEDLE) ×3 IMPLANT
NS IRRIG 1000ML POUR BTL (IV SOLUTION) ×9 IMPLANT
OBTURATOR OPTICAL STANDARD 8MM (TROCAR)
OBTURATOR OPTICAL STND 8 DVNC (TROCAR)
OBTURATOR OPTICALSTD 8 DVNC (TROCAR) IMPLANT
PACK CHEST (CUSTOM PROCEDURE TRAY) ×3 IMPLANT
PAD ARMBOARD 7.5X6 YLW CONV (MISCELLANEOUS) ×15 IMPLANT
PORT ACCESS TROCAR AIRSEAL 12 (TROCAR) ×2 IMPLANT
PORT ACCESS TROCAR AIRSEAL 5M (TROCAR) ×1
RELOAD STAPLER 2.5X45 WHT DVNC (STAPLE) ×4 IMPLANT
RELOAD STAPLER 3.5X45 BLU DVNC (STAPLE) ×8 IMPLANT
RELOAD STAPLER 4.3X45 GRN DVNC (STAPLE) ×2 IMPLANT
SEAL CANN UNIV 5-8 DVNC XI (MISCELLANEOUS) ×4 IMPLANT
SEAL XI 5MM-8MM UNIVERSAL (MISCELLANEOUS) ×2
SEALANT SURG COSEAL 4ML (VASCULAR PRODUCTS) IMPLANT
SEALANT SURG COSEAL 8ML (VASCULAR PRODUCTS) IMPLANT
SET TRI-LUMEN FLTR TB AIRSEAL (TUBING) ×3 IMPLANT
SOLUTION ELECTROLUBE (MISCELLANEOUS) IMPLANT
SPONGE INTESTINAL PEANUT (DISPOSABLE) IMPLANT
STAPLER 45 SUREFORM CVD (STAPLE) ×1
STAPLER 45 SUREFORM CVD DVNC (STAPLE) ×2 IMPLANT
STAPLER CANNULA SEAL DVNC XI (STAPLE) ×4 IMPLANT
STAPLER CANNULA SEAL XI (STAPLE) ×2
STAPLER RELOAD 2.5X45 WHITE (STAPLE) ×2
STAPLER RELOAD 2.5X45 WHT DVNC (STAPLE) ×4
STAPLER RELOAD 3.5X45 BLU DVNC (STAPLE) ×8
STAPLER RELOAD 3.5X45 BLUE (STAPLE) ×4
STAPLER RELOAD 4.3X45 GREEN (STAPLE) ×1
STAPLER RELOAD 4.3X45 GRN DVNC (STAPLE) ×2
STOPCOCK 4 WAY LG BORE MALE ST (IV SETS) ×3 IMPLANT
SUT MON AB 2-0 CT1 36 (SUTURE) IMPLANT
SUT PDS AB 1 CTX 36 (SUTURE) IMPLANT
SUT PROLENE 4 0 RB 1 (SUTURE)
SUT PROLENE 4-0 RB1 .5 CRCL 36 (SUTURE) IMPLANT
SUT SILK  1 MH (SUTURE) ×1
SUT SILK 1 MH (SUTURE) ×2 IMPLANT
SUT SILK 1 TIES 10X30 (SUTURE) IMPLANT
SUT SILK 2 0 SH (SUTURE) IMPLANT
SUT SILK 2 0SH CR/8 30 (SUTURE) IMPLANT
SUT VIC AB 1 CTX 36 (SUTURE)
SUT VIC AB 1 CTX36XBRD ANBCTR (SUTURE) IMPLANT
SUT VIC AB 2-0 CT1 27 (SUTURE) ×1
SUT VIC AB 2-0 CT1 TAPERPNT 27 (SUTURE) ×2 IMPLANT
SUT VIC AB 3-0 SH 27 (SUTURE) ×3
SUT VIC AB 3-0 SH 27X BRD (SUTURE) ×6 IMPLANT
SUT VICRYL 0 TIES 12 18 (SUTURE) ×3 IMPLANT
SUT VICRYL 0 UR6 27IN ABS (SUTURE) ×6 IMPLANT
SUT VICRYL 2 TP 1 (SUTURE) IMPLANT
SYR 10ML LL (SYRINGE) ×3 IMPLANT
SYR 20ML LL LF (SYRINGE) ×3 IMPLANT
SYR 50ML LL SCALE MARK (SYRINGE) ×3 IMPLANT
SYSTEM RETRIEVAL ANCHOR 15 (MISCELLANEOUS) ×3 IMPLANT
SYSTEM RETRIEVAL ANCHOR 8 (MISCELLANEOUS) ×3 IMPLANT
SYSTEM SAHARA CHEST DRAIN ATS (WOUND CARE) ×3 IMPLANT
TAPE CLOTH 4X10 WHT NS (GAUZE/BANDAGES/DRESSINGS) ×3 IMPLANT
TAPE CLOTH SURG 4X10 WHT LF (GAUZE/BANDAGES/DRESSINGS) ×3 IMPLANT
TIP APPLICATOR SPRAY EXTEND 16 (VASCULAR PRODUCTS) IMPLANT
TOWEL GREEN STERILE (TOWEL DISPOSABLE) ×3 IMPLANT
TRAY FOLEY MTR SLVR 16FR STAT (SET/KITS/TRAYS/PACK) ×3 IMPLANT
TROCAR BLADELESS 15MM (ENDOMECHANICALS) IMPLANT
TUBING EXTENTION W/L.L. (IV SETS) ×3 IMPLANT
WATER STERILE IRR 1000ML POUR (IV SOLUTION) ×3 IMPLANT

## 2021-09-18 SURGICAL SUPPLY — 44 items
ADAPTER BRONCH F/PENTAX (ADAPTER) ×3 IMPLANT
ADAPTER VALVE BIOPSY EBUS (MISCELLANEOUS) IMPLANT
ADPTR VALVE BIOPSY EBUS (MISCELLANEOUS)
BRUSH CYTOL CELLEBRITY 1.5X140 (MISCELLANEOUS) ×3 IMPLANT
BRUSH SUPERTRAX BIOPSY (INSTRUMENTS) IMPLANT
BRUSH SUPERTRAX NDL-TIP CYTO (INSTRUMENTS) ×3 IMPLANT
CANISTER SUCT 3000ML PPV (MISCELLANEOUS) ×3 IMPLANT
CHANNEL WORK EXTEND EDGE 180 (KITS) IMPLANT
CHANNEL WORK EXTEND EDGE 45 (KITS) IMPLANT
CHANNEL WORK EXTEND EDGE 90 (KITS) IMPLANT
CONT SPEC 4OZ CLIKSEAL STRL BL (MISCELLANEOUS) ×3 IMPLANT
COVER BACK TABLE 60X90IN (DRAPES) ×3 IMPLANT
FILTER STRAW FLUID ASPIR (MISCELLANEOUS) IMPLANT
FORCEPS BIOP SUPERTRX PREMAR (INSTRUMENTS) ×3 IMPLANT
GAUZE SPONGE 4X4 12PLY STRL (GAUZE/BANDAGES/DRESSINGS) ×3 IMPLANT
GLOVE SURG SS PI 7.5 STRL IVOR (GLOVE) ×6 IMPLANT
GOWN STRL REUS W/ TWL LRG LVL3 (GOWN DISPOSABLE) ×4 IMPLANT
GOWN STRL REUS W/TWL LRG LVL3 (GOWN DISPOSABLE) ×2
KIT CLEAN ENDO COMPLIANCE (KITS) ×3 IMPLANT
KIT LOCATABLE GUIDE (CANNULA) IMPLANT
KIT MARKER FIDUCIAL DELIVERY (KITS) IMPLANT
KIT PROCEDURE EDGE 180 (KITS) IMPLANT
KIT PROCEDURE EDGE 45 (KITS) IMPLANT
KIT PROCEDURE EDGE 90 (KITS) IMPLANT
KIT TURNOVER KIT B (KITS) ×3 IMPLANT
MARKER SKIN DUAL TIP RULER LAB (MISCELLANEOUS) ×3 IMPLANT
NEEDLE SUPERTRX PREMARK BIOPSY (NEEDLE) ×3 IMPLANT
NS IRRIG 1000ML POUR BTL (IV SOLUTION) ×3 IMPLANT
OIL SILICONE PENTAX (PARTS (SERVICE/REPAIRS)) ×3 IMPLANT
PAD ARMBOARD 7.5X6 YLW CONV (MISCELLANEOUS) ×6 IMPLANT
PATCHES PATIENT (LABEL) ×9 IMPLANT
SOL ANTI FOG 6CC (MISCELLANEOUS) ×2 IMPLANT
SOLUTION ANTI FOG 6CC (MISCELLANEOUS) ×1
SYR 20CC LL (SYRINGE) ×3 IMPLANT
SYR 20ML ECCENTRIC (SYRINGE) ×3 IMPLANT
SYR 50ML SLIP (SYRINGE) ×3 IMPLANT
TOWEL OR 17X24 6PK STRL BLUE (TOWEL DISPOSABLE) ×3 IMPLANT
TRAP SPECIMEN MUCOUS 40CC (MISCELLANEOUS) IMPLANT
TUBE CONNECTING 20X1/4 (TUBING) ×3 IMPLANT
UNDERPAD 30X30 (UNDERPADS AND DIAPERS) ×3 IMPLANT
VALVE BIOPSY  SINGLE USE (MISCELLANEOUS) ×1
VALVE BIOPSY SINGLE USE (MISCELLANEOUS) ×2 IMPLANT
VALVE SUCTION BRONCHIO DISP (MISCELLANEOUS) ×3 IMPLANT
WATER STERILE IRR 1000ML POUR (IV SOLUTION) ×3 IMPLANT

## 2021-09-18 NOTE — Brief Op Note (Signed)
09/18/2021  10:34 AM  PATIENT:  Gail Salinas  80 y.o. 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 Frozen wedge LLL adenocarcinoma  DISPOSITION OF SPECIMEN:  PATHOLOGY  COUNTS CORRECT:  YES  DICTATION: .Dragon Dictation  PLAN OF CARE: Admit to inpatient   PATIENT DISPOSITION:  PACU - hemodynamically stable.   Delay start of Pharmacological VTE agent (>24hrs) due to surgical blood loss or risk of bleeding: no

## 2021-09-18 NOTE — Care Management CC44 (Signed)
Condition Code 44 Documentation Completed  Patient Details  Name: LANEE CHAIN MRN: [id number] Date of Birth: [birth year]   Condition Code 44 given:  Yes Patient signature on Condition Code 44 notice:  Yes Documentation of 2 MD's agreement:  Yes Code 44 added to claim:  Yes    Zenon Mayo, RN 09/18/2021, 6:14 PM

## 2021-09-18 NOTE — Discharge Summary (Addendum)
Physician Discharge Summary       [address]       [place] [address]             [phone]    Patient ID: Gail Salinas MRN: [id number] DOB/AGE: November 10, 1940 80 y.o.  Admit date: 09/18/2021 Discharge date: 09/20/2021  Admission Diagnoses: Left lower lobe lung nodule  Discharge Diagnoses:  S/p Xi robotic assisted left thoracoscopy, wedge LLL, LLL, LN dissection, intercostal nerve block Adenocarcinoma LLL History of the following: Anxiety      COPD (chronic obstructive pulmonary disease) (HCC)     GERD (gastroesophageal reflux disease)     OCD (obsessive compulsive disorder)        Consults: None  Procedure (s):  Xi robotic assisted left thoracoscopy, wedge LLL, LLL, LN dissection, and intercostal nerve block by Dr. Kipp Brood on 09/18/2021.  Pathology:Final pathology results pending.   History of Presenting Illness: his is a 80 y.o. female referred by Dr. Valeta Harms for surgical evaluation of a left upper lobe pulmonary nodule that was found incidentally as well as a large hiatal hernia.  This was found incidentally.  She underwent a chest x-ray for shortness of breath and was noted to have a hiatal hernia.  On evaluation by the surgeon a CT chest was ordered which then identified the pulmonary nodule.  She is a lifelong non-smoker however she has been exposed to secondhand smoke.  She does admit to some fatigue and some shortness of breath.  But denies any neurologic symptoms.  She also admits to some dysphagia to liquids.  She denies any odynophagia. This is a 80 year old female who was noted to have an incidental 1.7 cm left upper lobe pulmonary nodule as well as a large paraesophageal hernia. Dr. Kipp Brood reviewed the CT scan and PET/CT.  She does not have significant uptake in the pulmonary nodule.  Based on its appearance and size, it is somewhat concerning for primary lung cancer.  Dr. Kipp Brood and the patient discussed the risks and benefits of a  combination procedure with Dr. Valeta Harms which will include navigational bronchoscopy with biopsy and marking followed by a robotic assisted left upper lobe wedge resection and possible lobectomy. Potential risks, benefits, and complications of the surgery were discussed with the patient and she agreed to proceed with surgery.  Brief Hospital Course:  Patient underwent Flexible video fiberoptic bronchoscopy with robotic assistance and biopsies by Dr. Valeta Harms then underwent a robotic assisted left VATS, wedge LLL, LLL, LN dissection, and intercostal nerve block on 09/18/2021. She was extubated and transferred from the OR to PACU in stable condition. She remained afebrile and hemodynamically stable. Chest tube was to water seal and there was no air leak. CXR showed no pneumothorax. Dr. Kipp Brood ordered chest tube to be removed and this was done on 11/18. Patient has been tolerating a diet. Patient was weaned to room air and had good oxygenation. All wounds are clean, dry, and healing without sign of infection. CXR 11/19 showed stable trace apical pneumothorax.  There was mild sub q emphysema from chest tube site and stable large hiatal hernia.  Sh ewas hypokalemic which is chronic for the patient she was supplemented during her hospitalization and will resume home regimen at discharge.  The patient is surgically stable for discharge today.   Latest Vital Signs: Blood pressure 122/73, pulse 77, temperature 98.2 F (36.8 C), temperature source Oral, resp. rate 15, height 5\' 2"  (1.575 m), weight 82.1 kg, SpO2 97 %.  General appearance:  alert, cooperative, and no distress Heart: regular rate and rhythm Lungs: clear to auscultation bilaterally Abdomen: soft, non-tender; bowel sounds normal; no masses,  no organomegaly Extremities: extremities normal, atraumatic, no cyanosis or edema Wound: clean and dry  Discharge Condition:Stable and discharged to home.  Recent laboratory studies:  Lab Results  Component  Value Date   WBC 12.7 (H) 09/20/2021   HGB 11.3 (L) 09/20/2021   HCT 33.6 (L) 09/20/2021   MCV 85.7 09/20/2021   PLT 244 09/20/2021   Lab Results  Component Value Date   NA 134 (L) 09/20/2021   K 3.0 (L) 09/20/2021   CL 100 09/20/2021   CO2 27 09/20/2021   CREATININE 0.90 09/20/2021   GLUCOSE 122 (H) 09/20/2021     Diagnostic Studies: DG Chest 2 View  Result Date: 09/20/2021 CLINICAL DATA:  Pneumothorax. EXAM: CHEST - 2 VIEW COMPARISON:  09/19/2021 FINDINGS: Left chest tube has been removed in the interval. There is a trace residual left apical pneumothorax. Large hiatal hernia again noted. Atelectasis visible at the left base. Subcutaneous emphysema is compatible with the previous chest tube. Bones are demineralized. Telemetry leads overlie the chest. IMPRESSION: Interval removal of left chest tube with trace residual left apical pneumothorax. Electronically Signed   By: Misty Stanley M.D.   On: 09/20/2021 05:56   DG Chest 2 View  Result Date: 09/17/2021 CLINICAL DATA:  80 year old female under preoperative evaluation prior to bronchoscopy. EXAM: CHEST - 2 VIEW COMPARISON:  Chest x-ray 11/30/2013. FINDINGS: Lung volumes are normal. No consolidative airspace disease. No pleural effusions. No pneumothorax. No pulmonary nodule or mass noted. Large hiatal hernia. Pulmonary vasculature and the cardiomediastinal silhouette are otherwise within normal limits. IMPRESSION: 1. No radiographic evidence of acute cardiopulmonary disease. 2. Large hiatal hernia. Electronically Signed   By: Vinnie Langton M.D.   On: 09/17/2021 09:21   DG CHEST PORT 1 VIEW  Result Date: 09/19/2021 CLINICAL DATA:  80 year old female postoperative day 1 status post left thoracoscopy, left lower lobe wedge resection, and node dissection for adenocarcinoma. EXAM: PORTABLE CHEST 1 VIEW COMPARISON:  09/18/2021 portable chest and earlier. FINDINGS: Portable AP semi upright view at 0722 hours. Stable left chest tube. No  pneumothorax identified. Left lower neck and chest wall gas appears stable to decreased. Mildly improved lung volumes. Stable cardiac size and mediastinal contours. Allowing for portable technique the lungs are clear. No acute osseous abnormality identified. Paucity of bowel gas in the upper abdomen. IMPRESSION: Stable left chest tube. No pneumothorax identified. Improved lung volumes and ventilation. Electronically Signed   By: Genevie Ann M.D.   On: 09/19/2021 07:38   DG Chest Port 1 View  Result Date: 09/18/2021 CLINICAL DATA:  Status post VATS left lung EXAM: PORTABLE CHEST 1 VIEW COMPARISON:  09/16/2021 FINDINGS: Transverse diameter of heart is increased. There is large fixed hiatal hernia. There is interval placement of left chest tube with its tip in the medial left upper lung fields. Subtle increased markings are seen in the left lower lung fields. There is no pleural effusion. There is minimal left apical pneumothorax. New transverse linear density in the right mid lung fields suggests subsegmental atelectasis. Subcutaneous emphysema is seen in the left chest wall and left side of neck. IMPRESSION: Postsurgical changes are noted in the left lung. Left chest tube is noted. There is tiny left apical pneumothorax. Possible subsegmental atelectasis is seen in the right mid and left lower lung fields. Electronically Signed   By: Prudy Feeler.D.  On: 09/18/2021 13:17   CT Super D Chest Wo Contrast  Result Date: 09/05/2021 CLINICAL DATA:  Lung nodule. EXAM: CT CHEST WITHOUT CONTRAST TECHNIQUE: Multidetector CT imaging of the chest was performed using thin slice collimation for electromagnetic bronchoscopy planning purposes, without intravenous contrast. COMPARISON:  PET 07/02/2021, CT chest 06/12/2021. FINDINGS: Cardiovascular: Atherosclerotic calcification of the aorta and coronary arteries. Heart size normal. No pericardial effusion. Mediastinum/Nodes: No pathologically enlarged mediastinal or  axillary lymph nodes. Hilar regions are difficult to definitively evaluate without IV contrast. Esophagus is grossly unremarkable. Large hiatal hernia. Lungs/Pleura: Part solid spiculated nodule in the superior segment left lower lobe measures 11 x 13 mm with an internal 4 mm solid component, as on 06/13/2011. There are extensions to the adjacent visceral pleura with slight retraction of the left major fissure. Lungs are otherwise clear. No pleural fluid. Airway is unremarkable. Upper Abdomen: Visualized portions of the liver, gallbladder, adrenal glands unremarkable. There may be punctate stones in the right kidney. Visualized portions of the kidneys, spleen, pancreas, stomach and bowel are otherwise unremarkable with exception of a large hiatal hernia. Musculoskeletal: Degenerative changes in the spine and shoulders. No worrisome lytic or sclerotic lesions. IMPRESSION: 1. Subsolid left lower lobe nodule is unchanged from 06/12/2021 and is characteristic of adenocarcinoma. 2. Large hiatal hernia. 3. Possible punctate stones in the right kidney. 4. Aortic atherosclerosis (ICD10-I70.0). Coronary artery calcification. Electronically Signed   By: Lorin Picket M.D.   On: 09/05/2021 12:06   DG C-ARM BRONCHOSCOPY  Result Date: 09/18/2021 C-ARM BRONCHOSCOPY: Fluoroscopy was utilized by the requesting physician.  No radiographic interpretation.     Discharge Medications: Allergies as of 09/20/2021       Reactions   Advair Diskus [fluticasone-salmeterol]    Panic attack   Codeine Nausea And Vomiting   Prednisone    Panic attack        Medication List     TAKE these medications    aspirin EC 81 MG tablet Take 81 mg by mouth daily.   calcium carbonate 1500 (600 Ca) MG Tabs tablet Commonly known as: OSCAL Take 1,500 mg by mouth 2 (two) times daily with a meal.   DULoxetine 30 MG capsule Commonly known as: CYMBALTA Take 30 mg by mouth daily.   magnesium gluconate 500 MG tablet Commonly  known as: MAGONATE Take 500 mg by mouth 2 (two) times daily.   montelukast 10 MG tablet Commonly known as: SINGULAIR Take 10 mg by mouth at bedtime.   omeprazole 20 MG capsule Commonly known as: PRILOSEC Take 20 mg by mouth daily.   potassium chloride SA 20 MEQ tablet Commonly known as: KLOR-CON Take 20 mEq by mouth 4 (four) times daily.   traMADol 50 MG tablet Commonly known as: ULTRAM Take 1 tablet (50 mg total) by mouth every 6 (six) hours as needed for moderate pain.        Follow Up Appointments:  Follow-up Information     Lajuana Matte, MD. Go on 10/03/2021.   Specialty: Cardiothoracic Surgery Why: Appointment time is at 10:10 am Contact information: [address] [place] [address] [phone]                 Signed: Ellamae Sia 09/20/2021, 10:23 AM

## 2021-09-18 NOTE — Anesthesia Postprocedure Evaluation (Signed)
Anesthesia Post Note  Patient: TAMRA KOOS  Procedure(s) Performed: VIDEO BRONCHOSCOPY WITH ENDOBRONCHIAL NAVIGATION (Left) BRONCHIAL NEEDLE ASPIRATION BIOPSIES BRONCHIAL BRUSHINGS FIDUCIAL DYE MARKING XI ROBOTIC ASSISTED THORASCOPY-LEFT LOWER LOBE WEDGE RESECTION (Left: Chest) LEFT LOWER LOBECTOMY LYMPH NODE DISSECTION INTERCOSTAL NERVE BLOCK     Patient location during evaluation: PACU Anesthesia Type: General Level of consciousness: awake and alert Pain management: pain level controlled Vital Signs Assessment: post-procedure vital signs reviewed and stable Respiratory status: spontaneous breathing, nonlabored ventilation, respiratory function stable and patient connected to nasal cannula oxygen Cardiovascular status: blood pressure returned to baseline and stable Postop Assessment: no apparent nausea or vomiting Anesthetic complications: no   No notable events documented.  Last Vitals:  Vitals:   09/18/21 1445 09/18/21 1500  BP: (!) 152/78 107/75  Pulse: 99 (!) 102  Resp: 17 15  Temp: 36.5 C 36.6 C  SpO2: 99% 99%    Last Pain:  Vitals:   09/18/21 1500  TempSrc: Oral  PainSc: 0-No pain                 Genna Casimir

## 2021-09-18 NOTE — Op Note (Signed)
      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 The assistant was needed for exposure, dissection, suctioning, retraction of delicate tissues and sutures, instrument exchange and for overall help during this procedure.    Anesthesia  general EBL: 50 ml Blood Administration: None Specimen: Left lower lobe wedge resection.  Left lower lobe.  Hilar lymph nodes.  Level 5 lymph nodes.  Level 9 lymph nodes.  Drains: 84 F argyle chest tube in left chest Counts: correct   Indications: This 80 year old female with an incidental 1.7 cm left upper lobe nodule.  She also has a history of a large paraesophageal hernia.  She was brought to the operating theater for a combination procedure with Dr. Valeta Harms in which she performed navigational bronchoscopy with biopsy along with a marking of the nodule.  She subsequently was brought to the operating theater for a wedge resection and possible lobectomy.  Findings: Good marking with ICG.  Biopsy was consistent with non-small cell lung cancer.  Completion lobectomy was performed.  Operative Technique: After the risks, benefits and alternatives were thoroughly discussed, the patient was brought to the operative theatre.  Anesthesia was induced, and the patient was then placed in a right lateral decubitus position and was prepped and draped in normal sterile fashion.  An appropriate surgical pause was  performed, and pre-operative antibiotics were dosed accordingly.  We began by placing our 4 robotic ports in the the 7th intercostal space targeting the hilum of the lung.  A 2mm assistant port was placed in the 9th intercostal space in the anterior axillary line.  The robot was then docked and all instruments were passed under direct visualization.    The lung was then retracted superiorly, and the inferior pulmonary ligament was divided.  The hilum was mobilized anteriorly and posteriorly.  We identified the pulmonary vein, and after careful isolation, it was divided with a vascular stapler.  We next moved to the fissure.  The artery was then divided with a vascular load stapler.  The bronchus to the lower lobe was then isolated.  After a test clamp, with good ventilation of the upper lobe, the bronchus was then divided.  The fissure was completed, and the specimen was passed into an endocatch bag.  It was removed from the anterior access site.    Lymph nodes were then sampled at levels 5, 9, and hilum.  The chest was irrigated, and an air leak test was performed.  An intercostal nerve block was performed under direct visualization.  A 28 F chest with then placed, and we watch the remaining lobes re-expand.  The skin and soft tissue were closed with absorbable suture    The patient tolerated the procedure without any immediate complications, and was transferred to the PACU in stable condition.  Gail Salinas

## 2021-09-18 NOTE — Plan of Care (Signed)
  Problem: Clinical Measurements: Goal: Cardiovascular complication will be avoided Outcome: Progressing   Problem: Clinical Measurements: Goal: Cardiovascular complication will be avoided Outcome: Progressing   Problem: Clinical Measurements: Goal: Cardiovascular complication will be avoided Outcome: Progressing   Problem: Nutrition: Goal: Adequate nutrition will be maintained Outcome: Progressing   Problem: Coping: Goal: Level of anxiety will decrease Outcome: Progressing   Problem: Elimination: Goal: Will not experience complications related to bowel motility Outcome: Progressing   Problem: Pain Managment: Goal: General experience of comfort will improve Outcome: Progressing   Problem: Safety: Goal: Ability to remain free from injury will improve Outcome: Progressing   Problem: Skin Integrity: Goal: Risk for impaired skin integrity will decrease Outcome: Progressing

## 2021-09-18 NOTE — Interval H&P Note (Signed)
History and Physical Interval Note:  09/18/2021 7:22 AM  Gail Salinas  has presented today for surgery, with the diagnosis of lung nodule.  The various methods of treatment have been discussed with the patient and family. After consideration of risks, benefits and other options for treatment, the patient has consented to  Procedure(s) with comments: [place] (Left) - ION w/ MB:ICG Fiducial Dye Marking as a surgical intervention.  The patient's history has been reviewed, patient examined, no change in status, stable for surgery.  I have reviewed the patient's chart and labs.  Questions were answered to the patient's satisfaction.     [place]

## 2021-09-18 NOTE — Anesthesia Procedure Notes (Signed)
Procedure Name: Intubation Date/Time: 09/18/2021 7:43 AM Performed by: Amadeo Garnet, CRNA Pre-anesthesia Checklist: Patient identified, Emergency Drugs available, Suction available and Patient being monitored Patient Re-evaluated:Patient Re-evaluated prior to induction Oxygen Delivery Method: Circle system utilized Preoxygenation: Pre-oxygenation with 100% oxygen Induction Type: IV induction Ventilation: Mask ventilation without difficulty Laryngoscope Size: Miller and 2 Grade View: Grade I Tube type: Oral Tube size: 8.5 mm Number of attempts: 1 Airway Equipment and Method: Stylet and Oral airway Placement Confirmation: ETT inserted through vocal cords under direct vision, positive ETCO2 and breath sounds checked- equal and bilateral Secured at: 22 cm Tube secured with: Tape Dental Injury: Teeth and Oropharynx as per pre-operative assessment and Injury to lip  Difficulty Due To: Difficult Airway- due to anterior larynx Comments: Able to obtain Grade 1 view with Miller 2; however, ETT was hard to advance into vocal cords. SRNA x2, CRNAx2.  Stylet needed to have a large upper curve applied in order to be able to pass through into larynx.  Pt's anatomy was anterior and to the right

## 2021-09-18 NOTE — Transfer of Care (Signed)
Immediate Anesthesia Transfer of Care Note  Patient: Gail Salinas  Procedure(s) Performed: VIDEO BRONCHOSCOPY WITH ENDOBRONCHIAL NAVIGATION (Left) BRONCHIAL NEEDLE ASPIRATION BIOPSIES BRONCHIAL BRUSHINGS FIDUCIAL DYE MARKING XI ROBOTIC ASSISTED THORASCOPY-LEFT LOWER LOBE WEDGE RESECTION WITH LOBECTOMY (Left: Chest)  Patient Location: PACU  Anesthesia Type:General  Level of Consciousness: awake and drowsy  Airway & Oxygen Therapy: Patient Spontanous Breathing and Patient connected to face mask oxygen  Post-op Assessment: Report given to RN and Post -op Vital signs reviewed and stable  Post vital signs: Reviewed and stable  Last Vitals:  Vitals Value Taken Time  BP 160/77 09/18/21 1101  Temp    Pulse 81 09/18/21 1103  Resp 17 09/18/21 1103  SpO2 100 % 09/18/21 1103  Vitals shown include unvalidated device data.  Last Pain:  Vitals:   09/18/21 0613  TempSrc: Oral  PainSc:          Complications: No notable events documented.

## 2021-09-18 NOTE — Care Management Obs Status (Signed)
[facility] NOTIFICATION   Patient Details  Name: Gail Salinas MRN: [id number] Date of Birth: Sep 17, 1941   Medicare Observation Status Notification Given:  Yes    Zenon Mayo, RN 09/18/2021, 6:14 PM

## 2021-09-18 NOTE — Hospital Course (Addendum)
HPI: This is a 80 y.o. female referred by Dr. Valeta Harms for surgical evaluation of a left upper lobe pulmonary nodule that was found incidentally as well as a large hiatal hernia.  This was found incidentally.  She underwent a chest x-ray for shortness of breath and was noted to have a hiatal hernia.  On evaluation by the surgeon a CT chest was ordered which then identified the pulmonary nodule.  She is a lifelong non-smoker however she has been exposed to secondhand smoke.  She does admit to some fatigue and some shortness of breath.  But denies any neurologic symptoms.  She also admits to some dysphagia to liquids.  She denies any odynophagia. This is a 80 year old female who was noted to have an incidental 1.7 cm left upper lobe pulmonary nodule as well as a large paraesophageal hernia. Dr. Kipp Brood reviewed the CT scan and PET/CT.  She does not have significant uptake in the pulmonary nodule.  Based on its appearance and size, it is somewhat concerning for primary lung cancer.  Dr. Kipp Brood and the patient discussed the risks and benefits of a combination procedure with Dr. Valeta Harms which will include navigational bronchoscopy with biopsy and marking followed by a robotic assisted left upper lobe wedge resection and possible lobectomy. Potential risks, benefits, and complications of the surgery were discussed with the patient and she agreed to proceed with surgery.  Hospital Course: Patient underwent Flexible video fiberoptic bronchoscopy with robotic assistance and biopsies by Dr. Valeta Harms then underwent a robotic assisted left VATS, wedge LLL, LLL, LN dissection, and intercostal nerve block on 09/18/2021. She was extubated and transferred from the OR to PACU in stable condition. She remained afebrile and hemodynamically stable. Chest tube was to water seal and there was no air leak. CXR showed no pneumothorax. Dr. Kipp Brood ordered chest tube to be removed and this was done on 11/18. Patient has been tolerating a  diet. Patient was weaned to room air and had good oxygenation. All wounds are clean, dry, and healing without sign of infection. Per Dr. Kipp Brood, patient is surgically stable for discharge today.

## 2021-09-18 NOTE — H&P (Signed)
[address]       [place] [address]             [phone]                                                   Casmira C Myer [place] Medical Record #[id number] Date of Birth: May 11, 1941   Referring: Garner Nash, DO Primary Care: Dion Body, MD Primary Cardiologist: None   Chief Complaint:        Chief Complaint  Patient presents with   Consult      Initial surgical consult, hiatal hernia PET 8/31, ct chest 8/11 pft 10/5    No events since her last clinic appointment Vitals:   09/18/21 0613  BP: (!) 179/85  Pulse: 71  Resp: 18  Temp: 98 F (36.7 C)  SpO2: 99%   Alert NAD Sinus EWOB  OR today for Navigation bronchoscopy with Dr. Valeta Harms followed by Left RATS, left upper lobe wedge, possible lobectomy  Per my last clinic note    History of Present Illness:    DONA WALBY 80 y.o. female referred by Dr. Valeta Harms for surgical evaluation of a left upper lobe pulmonary nodule that was found incidentally as well as a large hiatal hernia.  This was found incidentally.  She underwent a chest x-ray for shortness of breath and was noted to have a hiatal hernia.  On evaluation by the surgeon a CT chest was ordered which then identified the pulmonary nodule.  She is a lifelong non-smoker however she has been exposed to secondhand smoke.  She does admit to some fatigue and some shortness of breath.  But denies any neurologic symptoms.  She also admits to some dysphagia to liquids.  She denies any odynophagia.           Zubrod Score: At the time of surgery this patient's most appropriate activity status/level should be described as: [x]     0    Normal activity, no symptoms []     1    Restricted in physical strenuous activity but ambulatory, able to do out light work []     2    Ambulatory and capable of self care, unable to do work activities, up and about               >50 % of waking hours                              []     3    Only  limited self care, in bed greater than 50% of waking hours []     4    Completely disabled, no self care, confined to bed or chair []     5    Moribund         Past Medical History:  Diagnosis Date   Anxiety     COPD (chronic obstructive pulmonary disease) (HCC)     GERD (gastroesophageal reflux disease)     OCD (obsessive compulsive disorder)             Past Surgical History:  Procedure Laterality Date   ABDOMINAL HYSTERECTOMY       BREAST BIOPSY Left 2015    Negative-Fibrocystic changes   COLONOSCOPY WITH PROPOFOL N/A  05/19/2018    Procedure: COLONOSCOPY WITH PROPOFOL;  Surgeon: Lollie Sails, MD;  Location: [facility] ENDOSCOPY;  Service: Endoscopy;  Laterality: N/A;   OOPHORECTOMY       TONSILLECTOMY               Family History  Problem Relation Age of Onset   Breast cancer Mother 67   Coronary artery disease Mother     Stroke Mother     Uterine cancer Mother     Vaginal cancer Mother     Colon cancer Father     Lung cancer Father          Social History       Tobacco Use  Smoking Status Never  Smokeless Tobacco Never    Social History        Substance and Sexual Activity  Alcohol Use Yes   Alcohol/week: 1.0 standard drink   Types: 1 Glasses of wine per week            Allergies  Allergen Reactions   Advair Diskus [Fluticasone-Salmeterol]     Codeine     Prednisone              Current Outpatient Medications  Medication Sig Dispense Refill   aspirin EC 81 MG tablet Take 81 mg by mouth daily.       calcium carbonate (OSCAL) 1500 (600 Ca) MG TABS tablet Take 1,500 mg by mouth 2 (two) times daily with a meal.       DULoxetine (CYMBALTA) 30 MG capsule Take 30 mg by mouth daily.       magnesium gluconate (MAGONATE) 500 MG tablet Take 500 mg by mouth 2 (two) times daily.       montelukast (SINGULAIR) 10 MG tablet Take 10 mg by mouth at bedtime.       omeprazole (PRILOSEC) 20 MG capsule Take 20 mg by mouth daily.       potassium chloride SA  (KLOR-CON) 20 MEQ tablet Take 20 mEq by mouth 4 (four) times daily.        No current facility-administered medications for this visit.      Review of Systems  Constitutional:  Positive for malaise/fatigue. Negative for weight loss.  Respiratory:  Positive for shortness of breath.   Cardiovascular:  Negative for chest pain.  Gastrointestinal:  Positive for heartburn and nausea.      PHYSICAL EXAMINATION: BP (!) 164/78 (BP Location: Right Arm, Patient Position: Sitting)   Pulse 88   Resp 20   Ht 5\' 1"  (1.549 m)   Wt 180 lb (81.6 kg)   SpO2 98% Comment: RA  BMI 34.01 kg/m  Physical Exam Constitutional:      General: She is not in acute distress.    Appearance: Normal appearance. She is normal weight. She is not ill-appearing.  HENT:     Head: Normocephalic and atraumatic.  Eyes:     Extraocular Movements: Extraocular movements intact.  Cardiovascular:     Rate and Rhythm: Normal rate and regular rhythm.     Heart sounds: No murmur heard. Pulmonary:     Effort: Pulmonary effort is normal. No respiratory distress.     Breath sounds: Normal breath sounds.  Abdominal:     General: Abdomen is flat.    Musculoskeletal:        General: Normal range of motion.  Skin:    General: Skin is warm and dry.  Neurological:     General: No focal deficit present.  Mental Status: She is alert and oriented to person, place, and time.      Diagnostic Studies & Laboratory data:     Recent Radiology Findings:    Imaging Results  No results found.         I have independently reviewed the above radiology studies  and reviewed the findings with the patient.    Recent Lab Findings: Recent Labs       Lab Results  Component Value Date    WBC 7.7 06/04/2021    HGB 13.6 06/04/2021    HCT 40.0 06/04/2021    PLT 294 06/04/2021    GLUCOSE 102 (H) 06/04/2021    ALT 12 06/04/2021    AST 20 06/04/2021    NA 136 06/04/2021    K 3.7 06/04/2021    CL 100 06/04/2021    CREATININE  0.75 06/04/2021    BUN 17 06/04/2021    CO2 26 06/04/2021          PFTs: - FVC: 87% - FEV1: 92% -DLCO: 77%   Problem List: 1.7 cm left upper lobe pulmonary nodule Large paraesophageal hernia   Assessment / Plan:   This is a 80 year old female who was noted to have an incidental 1.7 cm left upper lobe pulmonary nodule as well as a large paraesophageal hernia.  I personally reviewed the CT scan and PET/CT.  She does not have significant uptake in the pulmonary nodule.  Based on its appearance and size it is somewhat concerning for primary lung cancer.  We discussed the risks and benefits of a combination procedure with Dr. Valeta Harms which will include navigational bronchoscopy with biopsy and marking followed by a robotic assisted left upper lobe wedge resection and possible lobectomy.  Regardless of the bronchoscopic biopsy results the patient states that she would like this nodule removed.  I will meet with Dr. Valeta Harms to discuss timing.   In regards to the paraesophageal hernia she is symptomatic from, and I think that is also contributing to her shortness of breath.  We will address this following recovered from her lung surgery.      I  spent 40 minutes with  the patient face to face in counseling and coordination of care.     Aino Heckert Bary Leriche

## 2021-09-18 NOTE — Discharge Instructions (Signed)
Robot-Assisted Thoracic Surgery Robot-assisted thoracic surgery is a procedure in which robotic arms and surgical instruments are used to perform complex procedures through small incisions. During surgery, the surgeon sits at a console inside of the operating room and uses controls at the console to move the robotic arms. Surgical instruments are attached to the ends of the robotic arms. Instruments include a tool with a light and camera on the end of it (thoracoscope). The camera sends images to a video monitor that your surgeon will use to view the inside of your thoracic area. The thoracic area is between the neck and abdomen. Robot-assisted thoracic surgery may be done to: Remove a tissue sample to be tested in a lab (biopsy). Remove a part of a lung (lobectomy) or the whole lung (pneumonectomy). Remove tumors. Treat other conditions that affect: Your esophagus. This is the part of your body that carries food and liquid from your mouth to your stomach. Your diaphragm. This is a muscle wall between your lungs and stomach area. It helps with breathing. Remove a portion of the nerves (sympathectomy) that cause excessive sweating. Tell a health care provider about: Any allergies you have. This is especially important if you have allergies to medicines or sedatives. All medicines you are taking, including vitamins, herbs, eye drops, creams, and over-the-counter medicines. Any problems you or family members have had with anesthetic medicines. Any blood disorders you have. Any surgeries you have had. Any medical conditions you have. Whether you are pregnant or may be pregnant. What are the risks? Generally, this is a safe procedure. However, problems may occur, including: Infection, such as pneumonia. Severe bleeding (hemorrhage). Allergic reactions to medicines. Damage to organs or structures such as nerves or blood vessels. What happens before the procedure? Staying hydrated Follow  instructions from your health care provider about hydration, which may include: Up to 2 hours before the procedure - you may continue to drink clear liquids, such as water, clear fruit juice, black coffee, and plain tea. Eating and drinking restrictions Follow instructions from your health care provider about eating and drinking, which may include: 8 hours before the procedure - stop eating heavy meals or foods, such as meat, fried foods, or fatty foods. 6 hours before the procedure - stop eating light meals or foods, such as toast or cereal. 6 hours before the procedure - stop drinking milk or drinks that contain milk. 2 hours before the procedure - stop drinking clear liquids. Medicines Ask your health care provider about: Changing or stopping your regular medicines. This is especially important if you are taking diabetes medicines or blood thinners. Taking medicines such as aspirin and ibuprofen. These medicines can thin your blood. Do not take these medicines unless your health care provider tells you to take them. Taking over-the-counter medicines, vitamins, herbs, and supplements. Talk with your health care provider about safe and effective ways to manage pain before and after your procedure. Pain management should fit your specific health needs. Tests You may have tests, such as: CT scan. Ultrasound. Chest X-ray. Electrocardiogram (ECG). You may have a blood or urine sample taken. General instructions Ask your health care provider: How your surgery site will be marked. What steps will be taken to help prevent infection. These steps may include: Removing hair at the surgery site. Washing skin with a germ-killing soap. Receiving antibiotic medicine. Do not use any products that contain nicotine or tobacco for at least 4 weeks before the procedure. These products include cigarettes, chewing tobacco, and  vaping devices, such as e-cigarettes. If you need help quitting, ask your health  care provider. Plan to have a responsible adult take you home from the hospital or clinic. Plan to have a responsible adult care for you for the time you are told after you leave the hospital or clinic. This is important. What happens during the procedure? An IV will be inserted into one of your veins. You will be given one or more of the following: A medicine to help you relax (sedative). A medicine to make you fall asleep (general anesthetic). A medicine that is injected into an area of your body to numb everything below the injection site (regional anesthetic). One to four small incisions will be made. The number of incisions and the area where incisions are made will depend on the purpose of your procedure. A surgical assistant will then place instruments that are attached to the robotic arms into your thoracic cavity through these small incisions. The robotic arms are equipped with different surgical instruments and a high-definition 3D camera. The surgeon will sit at a control console and see a magnified, high-resolution 3D image of the surgical field on a monitor. The surgeon will use master controls from the console that respond to the surgeon's movements in real time. The computer-assisted robot will translate the surgeon's hand, wrist, or finger movements into precise movements of the four robotic arms so that the necessary procedures can be performed. A drainage tube may be placed to drain excess fluid from the chest cavity. Your incisions will be closed with stitches (sutures) or staples and covered with a bandage (dressing). The procedure may vary among health care providers and hospitals. What happens after the procedure? Your blood pressure, heart rate, breathing rate, and blood oxygen level will be monitored until you leave the hospital or clinic. You may have a drainage tube in place. It may stay in place for a few days after the procedure to monitor for signs of air or fluid  buildup in the chest cavity. Summary Robot-assisted thoracic surgery is a procedure in which robotic arms and surgical instruments are used to help perform complex procedures through small incisions. During surgery, the surgeon sits at a console in the operating room and uses controls at the console to move the robotic arms. A drainage tube may be placed to drain excess fluid from the chest cavity. The tube will be closely monitored for fluid or air buildup in the chest cavity. This information is not intended to replace advice given to you by your health care provider. Make sure you discuss any questions you have with your health care provider. Document Revised: 07/15/2020 Document Reviewed: 07/15/2020 Elsevier Patient Education  2022 [employer].

## 2021-09-18 NOTE — H&P (Signed)
     [address]       [place] [address]             [phone]       Addendum to my previous H&P L RATS, LLL wedge, possible lobectomy  Niquita Digioia O Zafira Munos

## 2021-09-18 NOTE — Anesthesia Procedure Notes (Signed)
Procedure Name: Intubation Date/Time: 09/18/2021 8:42 AM Performed by: Trinna Post., CRNA Pre-anesthesia Checklist: Patient identified, Emergency Drugs available, Suction available, Patient being monitored and Timeout performed Patient Re-evaluated:Patient Re-evaluated prior to induction Oxygen Delivery Method: Circle system utilized Preoxygenation: Pre-oxygenation with 100% oxygen Induction Type: IV induction Laryngoscope Size: Miller and 2 Grade View: Grade I Endobronchial tube: Left, Double lumen EBT, EBT position confirmed by auscultation and EBT position confirmed by fiberoptic bronchoscope and 35 Fr Number of attempts: 1 Airway Equipment and Method: Rigid stylet Placement Confirmation: ETT inserted through vocal cords under direct vision, positive ETCO2 and breath sounds checked- equal and bilateral Tube secured with: Tape Dental Injury: Teeth and Oropharynx as per pre-operative assessment

## 2021-09-18 NOTE — Op Note (Addendum)
Video Bronchoscopy with Robotic Assisted Bronchoscopic Navigation with three-dimensional cone beam CT imaging  Date of Operation: 09/18/2021   Pre-op Diagnosis: Lung nodule   Post-op Diagnosis: Lung nodule   Surgeon: Garner Nash, DO   Assistants: None   Anesthesia: General endotracheal anesthesia  Operation: Flexible video fiberoptic bronchoscopy with robotic assistance and biopsies.  Estimated Blood Loss: Minimal  Complications: None  Indications and History: Gail Salinas is a 80 y.o. female with history of lung nodule. The risks, benefits, complications, treatment options and expected outcomes were discussed with the patient.  The possibilities of pneumothorax, pneumonia, reaction to medication, pulmonary aspiration, perforation of a viscus, bleeding, failure to diagnose a condition and creating a complication requiring transfusion or operation were discussed with the patient who freely signed the consent.    Description of Procedure: The patient was seen in the Preoperative Area, was examined and was deemed appropriate to proceed.  The patient was taken to [facility] endoscopy room 3, identified as Clovis Cao and the procedure verified as Flexible Video Fiberoptic Bronchoscopy.  A Time Out was held and the above information confirmed.   Prior to the date of the procedure a high-resolution CT scan of the chest was performed. Utilizing ION software program a virtual tracheobronchial tree was generated to allow the creation of distinct navigation pathways to the patient's parenchymal abnormalities. After being taken to the operating room general anesthesia was initiated and the patient  was orally intubated. The video fiberoptic bronchoscope was introduced via the endotracheal tube and a general inspection was performed which showed normal right and left lung anatomy, aspiration of the bilateral mainstems was completed to remove any remaining secretions. Robotic catheter inserted into  patient's endotracheal tube.   Target #1 Left lower lobe nodule: The distinct navigation pathways prepared prior to this procedure were then utilized to navigate to patient's lesion identified on CT scan. The robotic catheter was secured into place and the vision probe was withdrawn.  Lesion location was approximated using fluoroscopy, radial endobronchial ultrasound, three-dimensional cone beam CT imaging for peripheral targeting. Under fluoroscopic guidance transbronchial needle brushings, transbronchial needle biopsies were performed to be sent for cytology and pathology.  After sampling using the transbronchial needle we used a 50% / 50% mixture of methylene blue and ICG.  We injected approximately 1 cc for fiducial dye marking.  At the end of the procedure a general airway inspection was performed and there was no evidence of active bleeding. The bronchoscope was removed.  The patient tolerated the procedure well. There was no significant blood loss and there were no obvious complications. A post-procedural chest x-ray is pending.  Samples Target #1: 1. Transbronchial needle brushings from LLL 2. Transbronchial Wang needle biopsies from LLL  Plans:  The patient will be discharged from the PACU to home when recovered from anesthesia and after chest x-ray is reviewed. We will review the cytology, pathology and microbiology results with the patient when they become available. Outpatient followup will be with Garner Nash, DO.  Garner Nash, DO [facility] 09/18/2021 8:29 AM

## 2021-09-19 ENCOUNTER — Encounter (HOSPITAL_COMMUNITY): Payer: Self-pay | Admitting: Thoracic Surgery (Cardiothoracic Vascular Surgery)

## 2021-09-19 ENCOUNTER — Observation Stay (HOSPITAL_COMMUNITY): Payer: Medicare Other

## 2021-09-19 DIAGNOSIS — C3432 Malignant neoplasm of lower lobe, left bronchus or lung: Secondary | ICD-10-CM | POA: Diagnosis not present

## 2021-09-19 LAB — CBC
HCT: 35.7 % — ABNORMAL LOW (ref 36.0–46.0)
Hemoglobin: 11.9 g/dL — ABNORMAL LOW (ref 12.0–15.0)
MCH: 28.4 pg (ref 26.0–34.0)
MCHC: 33.3 g/dL (ref 30.0–36.0)
MCV: 85.2 fL (ref 80.0–100.0)
Platelets: 297 10*3/uL (ref 150–400)
RBC: 4.19 MIL/uL (ref 3.87–5.11)
RDW: 13.2 % (ref 11.5–15.5)
WBC: 14.5 10*3/uL — ABNORMAL HIGH (ref 4.0–10.5)
nRBC: 0 % (ref 0.0–0.2)

## 2021-09-19 LAB — BASIC METABOLIC PANEL
Anion gap: 10 (ref 5–15)
BUN: 13 mg/dL (ref 8–23)
CO2: 26 mmol/L (ref 22–32)
Calcium: 8.4 mg/dL — ABNORMAL LOW (ref 8.9–10.3)
Chloride: 96 mmol/L — ABNORMAL LOW (ref 98–111)
Creatinine, Ser: 0.83 mg/dL (ref 0.44–1.00)
GFR, Estimated: >60 mL/min (ref >60)
Glucose, Bld: 128 mg/dL — ABNORMAL HIGH (ref 70–99)
Potassium: 2.8 mmol/L — ABNORMAL LOW (ref 3.5–5.1)
Sodium: 132 mmol/L — ABNORMAL LOW (ref 135–145)

## 2021-09-19 MED ORDER — POTASSIUM CHLORIDE CRYS ER 20 MEQ PO TBCR
20.0000 meq | EXTENDED_RELEASE_TABLET | ORAL | Status: AC
  Administered 2021-09-19 (×3): 20 meq via ORAL
  Filled 2021-09-19 (×3): qty 1

## 2021-09-19 MED ORDER — TRAMADOL HCL 50 MG PO TABS
50.0000 mg | ORAL_TABLET | Freq: Four times a day (QID) | ORAL | 0 refills | Status: DC | PRN
Start: 2021-09-19 — End: 2022-03-06

## 2021-09-19 NOTE — Progress Notes (Signed)
Ultram po given before chest tube removal. Tolerated  well.

## 2021-09-19 NOTE — Progress Notes (Signed)
Mobility Specialist Progress Note    09/19/21 1747  Mobility  Activity Ambulated in hall  Level of Assistance Independent  Assistive Device None  Distance Ambulated (ft) 280 ft  Mobility Ambulated independently in hallway  Mobility Response Tolerated well  Mobility performed by Mobility specialist  Bed Position Chair  $Mobility charge 1 Mobility   Pt received in chair and agreeable. No complaints on walk. Returned to chair with call bell in reach.   [facility] Mobility Specialist  M.S. Primary Phone: [phone] M.S. Secondary Phone: [phone]

## 2021-09-19 NOTE — Plan of Care (Signed)

## 2021-09-19 NOTE — Progress Notes (Addendum)
      [address]       [place] [address]             [phone]       1 Day Post-Op Procedure(s) (LRB): XI ROBOTIC ASSISTED THORASCOPY-LEFT LOWER LOBE WEDGE RESECTION (Left) LEFT LOWER LOBECTOMY LYMPH NODE DISSECTION INTERCOSTAL NERVE BLOCK  Subjective: Patient eating breakfast. She is requesting her potassium tablets as she takes them four times daily  Objective: Vital signs in last 24 hours: Temp:  [97 F (36.1 C)-98.5 F (36.9 C)] 98.5 F (36.9 C) (11/18 0401) Pulse Rate:  [70-102] 76 (11/18 0401) Cardiac Rhythm: Normal sinus rhythm (11/17 1900) Resp:  [12-24] 18 (11/18 0401) BP: (107-161)/(58-86) 127/70 (11/18 0401) SpO2:  [96 %-100 %] 97 % (11/18 0401)     Intake/Output from previous day: 11/17 0701 - 11/18 0700 In: 2040 [P.O.:240; I.V.:1700; IV Piggyback:100] Out: 1930 [Urine:1650; Blood:100; Chest Tube:180]   Physical Exam:  Cardiovascular: RRR Pulmonary: Clear to auscultation bilaterally Abdomen: Soft, non tender, bowel sounds present. Extremities: SCDs in place Wounds: Clean and dry.  No erythema or signs of infection. Chest Tube: to water seal, tidling with cough but no air leak  Lab Results: CBC: Recent Labs    09/16/21 1400 09/19/21 0121  WBC 8.9 14.5*  HGB 13.4 11.9*  HCT 40.2 35.7*  PLT 299 297   BMET:  Recent Labs    09/16/21 1400 09/19/21 0121  NA 135 132*  K 4.3 2.8*  CL 104 96*  CO2 21* 26  GLUCOSE 93 128*  BUN 20 13  CREATININE 0.80 0.83  CALCIUM 9.2 8.4*    PT/INR:  Recent Labs    09/16/21 1400  LABPROT 11.9  INR 0.9   ABG:  INR: Will add last result for INR, ABG once components are confirmed Will add last 4 CBG results once components are confirmed  Assessment/Plan:  1. CV - SR with HR in the 70-80's.  2.  Pulmonary - On 2 liters of oxygen via [place]. Wean as able. Chest tube with 180 cc since surgery. Chest tube is to water seal, no air leak. Await CXR this am. Encourage incentive spirometer.  If CXR stable, likely remove chest tube. Await final pathology. 3. Supplement potassium 4. Expected post op blood loss anemia-H and H this am 11.9 and 35.7  Donielle M ZimmermanPA-C 09/19/2021,7:09 AM [phone]   Agree with above. No leak on exam, chest x-ray showed well-expanded lung. Will remove chest tube today. Home tomorrow.  Teasia Zapf Bary Leriche

## 2021-09-20 ENCOUNTER — Observation Stay (HOSPITAL_COMMUNITY): Payer: Medicare Other

## 2021-09-20 DIAGNOSIS — C3432 Malignant neoplasm of lower lobe, left bronchus or lung: Secondary | ICD-10-CM | POA: Diagnosis not present

## 2021-09-20 LAB — COMPREHENSIVE METABOLIC PANEL
ALT: 11 U/L (ref 0–44)
AST: 19 U/L (ref 15–41)
Albumin: 2.8 g/dL — ABNORMAL LOW (ref 3.5–5.0)
Alkaline Phosphatase: 51 U/L (ref 38–126)
Anion gap: 7 (ref 5–15)
BUN: 14 mg/dL (ref 8–23)
CO2: 27 mmol/L (ref 22–32)
Calcium: 8.2 mg/dL — ABNORMAL LOW (ref 8.9–10.3)
Chloride: 100 mmol/L (ref 98–111)
Creatinine, Ser: 0.9 mg/dL (ref 0.44–1.00)
GFR, Estimated: >60 mL/min (ref >60)
Glucose, Bld: 122 mg/dL — ABNORMAL HIGH (ref 70–99)
Potassium: 3 mmol/L — ABNORMAL LOW (ref 3.5–5.1)
Sodium: 134 mmol/L — ABNORMAL LOW (ref 135–145)
Total Bilirubin: 0.5 mg/dL (ref 0.3–1.2)
Total Protein: 5.3 g/dL — ABNORMAL LOW (ref 6.5–8.1)

## 2021-09-20 LAB — CBC
HCT: 33.6 % — ABNORMAL LOW (ref 36.0–46.0)
Hemoglobin: 11.3 g/dL — ABNORMAL LOW (ref 12.0–15.0)
MCH: 28.8 pg (ref 26.0–34.0)
MCHC: 33.6 g/dL (ref 30.0–36.0)
MCV: 85.7 fL (ref 80.0–100.0)
Platelets: 244 10*3/uL (ref 150–400)
RBC: 3.92 MIL/uL (ref 3.87–5.11)
RDW: 13.8 % (ref 11.5–15.5)
WBC: 12.7 10*3/uL — ABNORMAL HIGH (ref 4.0–10.5)
nRBC: 0 % (ref 0.0–0.2)

## 2021-09-20 MED ORDER — POTASSIUM CHLORIDE CRYS ER 20 MEQ PO TBCR
40.0000 meq | EXTENDED_RELEASE_TABLET | Freq: Once | ORAL | Status: AC
  Administered 2021-09-20: 40 meq via ORAL
  Filled 2021-09-20: qty 2

## 2021-09-20 NOTE — Progress Notes (Addendum)
      [address]       [place] [address]             [phone]      2 Days Post-Op Procedure(s) (LRB): XI ROBOTIC ASSISTED THORASCOPY-LEFT LOWER LOBE WEDGE RESECTION (Left) LEFT LOWER LOBECTOMY LYMPH NODE DISSECTION INTERCOSTAL NERVE BLOCK  Subjective:  Patient is up moving around the room.  Coughing up some blood tinged sputum.  Feels up to going home.  Objective: Vital signs in last 24 hours: Temp:  [97.9 F (36.6 C)-98.7 F (37.1 C)] 98.2 F (36.8 C) (11/19 0745) Pulse Rate:  [0-88] 77 (11/19 0745) Cardiac Rhythm: Normal sinus rhythm (11/19 0700) Resp:  [15-19] 15 (11/19 0745) BP: (97-128)/(51-74) 122/73 (11/19 0745) SpO2:  [95 %-98 %] 97 % (11/19 0745)  Intake/Output from previous day: 11/18 0701 - 11/19 0700 In: 320 [P.O.:320] Out: -  Intake/Output this shift: Total I/O In: 240 [P.O.:240] Out: 300 [Urine:300]  General appearance: alert, cooperative, and no distress Heart: regular rate and rhythm Lungs: clear to auscultation bilaterally Abdomen: soft, non-tender; bowel sounds normal; no masses,  no organomegaly Extremities: extremities normal, atraumatic, no cyanosis or edema Wound: clean and dry  Lab Results: Recent Labs    09/19/21 0121 09/20/21 0053  WBC 14.5* 12.7*  HGB 11.9* 11.3*  HCT 35.7* 33.6*  PLT 297 244   BMET:  Recent Labs    09/19/21 0121 09/20/21 0053  NA 132* 134*  K 2.8* 3.0*  CL 96* 100  CO2 26 27  GLUCOSE 128* 122*  BUN 13 14  CREATININE 0.83 0.90  CALCIUM 8.4* 8.2*    PT/INR: No results for input(s): LABPROT, INR in the last 72 hours. ABG    Component Value Date/Time   PHART 7.451 (H) 09/16/2021 1347   HCO3 22.7 09/16/2021 1347   ACIDBASEDEF 0.8 09/16/2021 1347   O2SAT 98.4 09/16/2021 1347   CBG (last 3)  Recent Labs    09/18/21 0616  GLUCAP 107*    Assessment/Plan: S/P Procedure(s) (LRB): XI ROBOTIC ASSISTED THORASCOPY-LEFT LOWER LOBE WEDGE RESECTION (Left) LEFT LOWER  LOBECTOMY LYMPH NODE DISSECTION INTERCOSTAL NERVE BLOCK  CV- hemodynamically stable in NSR Pulm- CXR with stable trace pneumothorax, mild sub q emphysema and large hiatal hernia Hypokalemia- improved to 3.0, this is chronic the patient takes 80 meq of potassium daily prior to admission,  will supplement today prior to discharge 4. Dispo- patient stable, will supplement potassium and plan d/c home today   LOS: 1 day   Ellwood Handler, PA-C 09/20/2021

## 2021-09-20 NOTE — Plan of Care (Signed)

## 2021-09-22 ENCOUNTER — Encounter (HOSPITAL_COMMUNITY): Payer: Self-pay | Admitting: Pulmonary Disease

## 2021-09-22 LAB — CYTOLOGY - NON PAP

## 2021-09-27 LAB — SURGICAL PATHOLOGY

## 2021-09-30 ENCOUNTER — Other Ambulatory Visit: Payer: Self-pay | Admitting: Thoracic Surgery (Cardiothoracic Vascular Surgery)

## 2021-09-30 DIAGNOSIS — R911 Solitary pulmonary nodule: Secondary | ICD-10-CM

## 2021-10-02 ENCOUNTER — Other Ambulatory Visit: Payer: Self-pay | Admitting: *Deleted

## 2021-10-02 NOTE — Progress Notes (Signed)
The proposed treatment discussed in cancer conference is for discussion purpose only and is not a binding recommendation. The patient was not physically examined nor present for their treatment options. Therefore, final treatment plans cannot be decided.  ?

## 2021-10-03 ENCOUNTER — Ambulatory Visit
Admission: RE | Admit: 2021-10-03 | Discharge: 2021-10-03 | Disposition: A | Discharge status: Home / Self Care | Payer: Medicare Other | Source: Ambulatory Visit | Attending: Thoracic Surgery (Cardiothoracic Vascular Surgery) | Admitting: Thoracic Surgery (Cardiothoracic Vascular Surgery)

## 2021-10-03 ENCOUNTER — Ambulatory Visit (INDEPENDENT_AMBULATORY_CARE_PROVIDER_SITE_OTHER): Payer: Self-pay | Admitting: Thoracic Surgery (Cardiothoracic Vascular Surgery)

## 2021-10-03 ENCOUNTER — Other Ambulatory Visit: Payer: Self-pay

## 2021-10-03 VITALS — BP 145/80 | HR 100 | Resp 20 | Ht 62.0 in | Wt 180.0 lb

## 2021-10-03 DIAGNOSIS — C3492 Malignant neoplasm of unspecified part of left bronchus or lung: Secondary | ICD-10-CM

## 2021-10-03 DIAGNOSIS — Z09 Encounter for follow-up examination after completed treatment for conditions other than malignant neoplasm: Secondary | ICD-10-CM

## 2021-10-03 DIAGNOSIS — R911 Solitary pulmonary nodule: Secondary | ICD-10-CM

## 2021-10-03 NOTE — Progress Notes (Signed)
      [address]       [place] [address]             [phone]        [name] Medical Record #[id number] Date of Birth: 02/21/1941  Referring: Garner Nash, DO Primary Care: Dion Body, MD Primary Cardiologist:None  Reason for visit:   follow-up  History of Present Illness:     Gail Salinas presents for her first follow-up appointment.  She has no complaints today.  Pain is well controlled.  Physical Exam: BP (!) 145/80   Pulse 100   Resp 20   Ht 5\' 2"  (1.575 m)   Wt 180 lb (81.6 kg)   SpO2 98% Comment: RA  BMI 32.92 kg/m   Alert NAD Incision clean, stitch removed.   Abdomen soft, ND No peripheral edema   Diagnostic Studies & Laboratory data:  Path:  FINAL MICROSCOPIC DIAGNOSIS:   A. LUNG, LEFT LOWER LOBE, WEDGE RESECTION:  -  Invasive adenocarcinoma, 1.2 cm  -  Margins uninvolved by adenocarcinoma  -  See oncology table and comment below   B. LYMPH NODE, LEVEL 9, EXCISION:  -  No carcinoma identified in one lymph node (0/1)   C. LYMPH NODE, HILAR #1, EXCISION:  -  No carcinoma identified in one lymph node (0/1)   D. LYMPH NODE, HILAR #2, EXCISION:  -  No carcinoma identified in one lymph node (0/1)   E. LYMPH NODE, HILAR #3, EXCISION:  -  No carcinoma identified in one lymph node (0/1)   F. LYMPH NODE, LEVEL 5 #1, EXCISION:  -  No carcinoma identified in one lymph node (0/1)   G. LUNG, LEFT LOWER LOBE, LOBECTOMY:  -  No residual adenocarcinoma identified   ONCOLOGY TABLE:   LUNG: Resection   Synchronous Tumors: Not applicable  Total Number of Primary Tumors: 1  Procedure: Wedge resection with completion lobectomy  Specimen Laterality: Left  Tumor Focality: Unifocal  Tumor Site: Lower lobe of lung  Tumor Size:       Total Tumor Size: 1.2 cm  Histologic Type: Adenocarcinoma, acinar predominant (lipidic 10 to 15%)  Visceral Pleura Invasion: Not identified  Direct Invasion of Adjacent  Structures: No adjacent structures present  Lymphovascular Invasion: Not identified  Margins: All margins negative for invasive carcinoma t  Treatment Effect: No known presurgical therapy  Regional Lymph Nodes:       Number of Lymph Nodes Involved: 0                            Nodal Sites with Tumor: N/A       Number of Lymph Nodes Examined: 5                       Nodal Sites Examined: Hilar, level 5, level 9  Distant Metastasis:       Distant Site(s) Involved: Not applicable  Pathologic Stage Classification (pTNM, AJCC 8th Edition): pT1b, pN0    Assessment / Plan:   80 year old female status post robotic assisted left lower lobectomy for a T1b N0 M0 stage I adenocarcinoma.  Pathology was reviewed today.  Overall she is doing well.  She will follow-up in 1 month with a chest x-ray.   Gail Salinas 10/03/2021 4:33 PM

## 2021-10-23 ENCOUNTER — Other Ambulatory Visit: Payer: Self-pay | Admitting: Thoracic Surgery (Cardiothoracic Vascular Surgery)

## 2021-10-23 DIAGNOSIS — R911 Solitary pulmonary nodule: Secondary | ICD-10-CM

## 2021-11-07 ENCOUNTER — Ambulatory Visit (INDEPENDENT_AMBULATORY_CARE_PROVIDER_SITE_OTHER): Payer: Self-pay | Admitting: Thoracic Surgery (Cardiothoracic Vascular Surgery)

## 2021-11-07 ENCOUNTER — Ambulatory Visit
Admission: RE | Admit: 2021-11-07 | Discharge: 2021-11-07 | Disposition: A | Discharge status: Home / Self Care | Payer: Medicare Other | Source: Ambulatory Visit | Attending: Thoracic Surgery (Cardiothoracic Vascular Surgery) | Admitting: Thoracic Surgery (Cardiothoracic Vascular Surgery)

## 2021-11-07 ENCOUNTER — Telehealth: Payer: Self-pay | Admitting: *Deleted

## 2021-11-07 ENCOUNTER — Other Ambulatory Visit: Payer: Self-pay

## 2021-11-07 VITALS — BP 143/90 | HR 109 | Resp 20 | Ht 62.0 in | Wt 180.0 lb

## 2021-11-07 DIAGNOSIS — R911 Solitary pulmonary nodule: Secondary | ICD-10-CM

## 2021-11-07 DIAGNOSIS — C3492 Malignant neoplasm of unspecified part of left bronchus or lung: Secondary | ICD-10-CM

## 2021-11-07 DIAGNOSIS — Z09 Encounter for follow-up examination after completed treatment for conditions other than malignant neoplasm: Secondary | ICD-10-CM

## 2021-11-07 NOTE — Telephone Encounter (Signed)
I received referral on Ms. Gail Salinas today. I called her to schedule but was unable to reach nor leave vm message.

## 2021-11-07 NOTE — Progress Notes (Signed)
° °   °  [address]       [place] [address]             [phone]        Gail Salinas [facility] Medical Record #[id number] Date of Birth: 1940/12/23  Referring: Garner Nash, DO Primary Care: Dion Body, MD Primary Cardiologist:None  Reason for visit:   follow-up  History of Present Illness:     Mr. Gail Salinas presents for 1 month follow-up appointment.  Overall she is doing well.  She does have some occasional shortness of breath with exertion.  Her pain is under good control.  She does continue to have some numbness upper abdomen.  Physical Exam: BP (!) 143/90 (BP Location: Right Arm, Patient Position: Sitting)    Pulse (!) 109    Resp 20    Ht 5\' 2"  (1.575 m)    Wt 180 lb (81.6 kg)    SpO2 96%    BMI 32.92 kg/m   Alert NAD Incision well-healed.  Abdomen soft, ND No peripheral edema   Diagnostic Studies & Laboratory data: CXR: Clear     Assessment / Plan:   81 year old female status post robotic assisted left lower lobectomy for stage I adenocarcinoma.  She will require ongoing surveillance with medical oncology.  She will follow-up with [place] as needed.   Lajuana Matte 11/07/2021 8:56 PM

## 2021-11-10 ENCOUNTER — Encounter: Payer: Self-pay | Admitting: *Deleted

## 2021-11-10 DIAGNOSIS — R911 Solitary pulmonary nodule: Secondary | ICD-10-CM

## 2021-11-10 NOTE — Progress Notes (Signed)
Oncology Nurse Navigator Documentation  Oncology Nurse Navigator Flowsheets 11/10/2021  Abnormal Finding Date 06/13/2021  Confirmed Diagnosis Date 09/18/2021  Diagnosis Status Confirmed Diagnosis Complete  Planned Course of Treatment Surgery  Phase of Treatment Surgery  Surgery Actual Start Date: 09/18/2021  Navigator Follow Up Date: 11/24/2021  Navigator Follow Up Reason: New Patient Appointment  Navigator Location [facility]  Referral Date to RadOnc/MedOnc 11/07/2021  Navigator Encounter Type Telephone/I received referral on Ms. Freilich. I called and scheduled her to be seen with Cassie on 1/23.  She verbalized understanding.    Telephone Outgoing Call  Treatment Initiated Date 09/18/2021  Patient Visit Type Other  Treatment Phase Other  Barriers/Navigation Needs Coordination of Care;Education  Education Other  Interventions Coordination of Care;Education;Psycho-Social Support  Acuity Level 2-Minimal Needs (1-2 Barriers Identified)  Coordination of Care Appts  Education Method Verbal  Time Spent with Patient 30

## 2021-11-23 NOTE — Progress Notes (Signed)
[facility] Telephone:([phone]   [phone]  CONSULT NOTE  REFERRING PHYSICIAN: Dr. Kipp Brood  REASON FOR CONSULTATION:  Stage Ia Non-Small Cell Lung Cancer, Adenocarcinoma   HPI Gail Salinas is a 81 y.o. female with a past medical history significant for GERD and anxiety referred to the clinic for newly diagnosed non-small cell lung cancer, adenocarcinoma s/p resection.  The patient's evaluation began on June 12, 2021.  The patient had a CT scan of the chest, abdomen and pelvis to evaluate shortness of breath.  CT scan showed a large sliding-type hiatal hernia as well as an incidental 17 x 10 mm subpleural density in the superior segment of the left lower lobe with pleural tail extending into the left major fissure and left posterior chest wall with some degree of retraction of the left major fissure.  It is noted that this lesion was surrounded by pulmonary vessel and the lesion was found to be concerning for malignancy.   A PET scan was ordered and performed on 07/02/2021 for further evaluate this lesion.  The left lower lobe nodule had a SUV of 1.2; however, the lesion has not significantly changed over the last 20 days and a low-grade adenocarcinoma is considered a differential.   Dr. Kipp Brood from cardiothoracic surgery and Dr. Valeta Harms from pulmonary medicine arrange for a navigational bronchoscopy with biopsy/marking followed by robotic assisted left lower lobe wedge resection which led to left lower lobe lobectomy. This was performed on 09/18/2021.  The final pathology showed a 1.2 cm non-small cell lung cancer, adenocarcinoma.  5/5 lymph nodes were negative.  This was staged as a pT1b, PN 0, stage I non-small cell lung cancer.  The patient has been doing well since her surgery.  She denies any fever, chills, night sweats, or unexplained weight loss.  She reports that her breathing is "okay".  She reports that she has baseline dyspnea on exertion which  has been going on for several years even prior to her recent lobectomy.  The patient denies any cough, hemoptysis, or chest pain.  Denies any nausea, vomiting, diarrhea, or constipation.  Denies any headache or visual changes.  The patient's family history consists of a father who passed away secondary to metastatic colorectal cancer at the age of 75.  The patient's mother has a history of breast cancer and vaginal cancer.  The patient used to work as a Art therapist.  She is married and has 2 children.  Denies any current or prior drug, alcohol, or tobacco use. HPI  Past Medical History:  Diagnosis Date   Anxiety    COPD (chronic obstructive pulmonary disease) (HCC)    GERD (gastroesophageal reflux disease)    History of hiatal hernia 2022   OCD (obsessive compulsive disorder)    PONV (postoperative nausea and vomiting)    Nauseated after surgery 40 years ago   Pre-diabetes     Past Surgical History:  Procedure Laterality Date   ABDOMINAL HYSTERECTOMY     BREAST BIOPSY Left 2015   Negative-Fibrocystic changes   BRONCHIAL BRUSHINGS  09/18/2021   Procedure: BRONCHIAL BRUSHINGS;  Surgeon: Garner Nash, DO;  Location: [facility];  Service: Pulmonary;;   BRONCHIAL NEEDLE ASPIRATION BIOPSY  09/18/2021   Procedure: BRONCHIAL NEEDLE ASPIRATION BIOPSIES;  Surgeon: Garner Nash, DO;  Location: [facility];  Service: Pulmonary;;   COLONOSCOPY WITH PROPOFOL N/A 05/19/2018   Procedure: COLONOSCOPY WITH PROPOFOL;  Surgeon: Lollie Sails, MD;  Location: [facility];  Service:  Endoscopy;  Laterality: N/A;   FIDUCIAL MARKER PLACEMENT  09/18/2021   Procedure: FIDUCIAL DYE MARKING;  Surgeon: Garner Nash, DO;  Location: [facility];  Service: Pulmonary;;   INTERCOSTAL NERVE BLOCK  09/18/2021   Procedure: INTERCOSTAL NERVE BLOCK;  Surgeon: Lajuana Matte, MD;  Location: [facility];  Service: Thoracic;;   LOBECTOMY  09/18/2021   Procedure: LEFT LOWER LOBECTOMY;  Surgeon:  Lajuana Matte, MD;  Location: [facility];  Service: Thoracic;;   LYMPH NODE DISSECTION  09/18/2021   Procedure: LYMPH NODE DISSECTION;  Surgeon: Lajuana Matte, MD;  Location: [facility];  Service: Thoracic;;   OOPHORECTOMY     TONSILLECTOMY     TUMOR EXCISION Left    pt states she had a 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 50+ years ago   [place] Left 09/18/2021   Procedure: VIDEO BRONCHOSCOPY WITH ENDOBRONCHIAL NAVIGATION;  Surgeon: Garner Nash, DO;  Location: [facility];  Service: Pulmonary;  Laterality: Left;  ION w/ MB:ICG Fiducial Dye Marking    Family History  Problem Relation Age of Onset   Breast cancer Mother 46   Coronary artery disease Mother    Stroke Mother    Uterine cancer Mother    Vaginal cancer Mother    Colon cancer Father    Lung cancer Father     Social History Social History   Tobacco Use   Smoking status: Never   Smokeless tobacco: Never  Vaping Use   Vaping Use: Never used  Substance Use Topics   Alcohol use: Not Currently    Comment: very rarely   Drug use: Never    Allergies  Allergen Reactions   Advair Diskus [Fluticasone-Salmeterol]     Panic attack   Codeine Nausea And Vomiting   Prednisone     Panic attack    Current Outpatient Medications  Medication Sig Dispense Refill   aspirin EC 81 MG tablet Take 81 mg by mouth daily.     calcium carbonate (OSCAL) 1500 (600 Ca) MG TABS tablet Take 1,500 mg by mouth 2 (two) times daily with a meal.     DULoxetine (CYMBALTA) 30 MG capsule Take 30 mg by mouth daily.     magnesium gluconate (MAGONATE) 500 MG tablet Take 500 mg by mouth 2 (two) times daily.     montelukast (SINGULAIR) 10 MG tablet Take 10 mg by mouth at bedtime.     omeprazole (PRILOSEC) 20 MG capsule Take 20 mg by mouth daily.     potassium chloride SA (KLOR-CON) 20 MEQ tablet Take 20 mEq by mouth 4 (four) times daily.     traMADol (ULTRAM) 50 MG tablet Take 1 tablet (50 mg total)  by mouth every 6 (six) hours as needed for moderate pain. 30 tablet 0   No current facility-administered medications for this visit.    REVIEW OF SYSTEMS:   Review of Systems  Constitutional: Negative for appetite change, chills, fatigue, fever and unexpected weight change.  HENT:   Negative for mouth sores, nosebleeds, sore throat and trouble swallowing.   Eyes: Negative for eye problems and icterus.  Respiratory: Positive for baseline dyspnea on exertion.  Negative for cough, hemoptysis, and wheezing.   Cardiovascular: Negative for chest pain and leg swelling.  Gastrointestinal: Negative for abdominal pain, constipation, diarrhea, nausea and vomiting.  Genitourinary: Negative for bladder incontinence, difficulty urinating, dysuria, frequency and hematuria.   Musculoskeletal: Negative for back pain, gait problem, neck pain and neck stiffness.  Skin: Negative for itching and rash.  Neurological: Negative for dizziness, extremity weakness, gait problem, headaches, light-headedness and seizures.  Hematological: Negative for adenopathy. Does not bruise/bleed easily.  Psychiatric/Behavioral: Negative for confusion, depression and sleep disturbance. The patient is not nervous/anxious.     PHYSICAL EXAMINATION:  Blood pressure (!) 153/89, pulse 86, temperature 97.8 F (36.6 C), temperature source Tympanic, resp. rate 17, weight 184 lb 2 oz (83.5 kg), SpO2 99 %.  ECOG PERFORMANCE STATUS: 0  Physical Exam  Constitutional: Oriented to person, place, and time and well-developed, well-nourished, and in no distress. HENT:  Head: Normocephalic and atraumatic.  Mouth/Throat: Oropharynx is clear and moist. No oropharyngeal exudate.  Eyes: Conjunctivae are normal. Right eye exhibits no discharge. Left eye exhibits no discharge. No scleral icterus.  Neck: Normal range of motion. Neck supple.  Cardiovascular: Normal rate, regular rhythm, normal heart sounds and intact distal pulses.    Pulmonary/Chest: Effort normal and breath sounds normal. No respiratory distress. No wheezes. No rales.  Abdominal: Soft. Bowel sounds are normal. Exhibits no distension and no mass. There is no tenderness.  Musculoskeletal: Normal range of motion. Exhibits no edema.  Lymphadenopathy:    No cervical adenopathy.  Neurological: Alert and oriented to person, place, and time. Exhibits normal muscle tone. Gait normal. Coordination normal.  Skin: Skin is warm and dry. No rash noted. Not diaphoretic. No erythema. No pallor.  Psychiatric: Mood, memory and judgment normal.  Vitals reviewed.  LABORATORY DATA: Lab Results  Component Value Date   WBC 7.4 11/24/2021   HGB 13.3 11/24/2021   HCT 40.7 11/24/2021   MCV 85.7 11/24/2021   PLT 300 11/24/2021      Chemistry      Component Value Date/Time   NA 138 11/24/2021 1241   NA 137 01/18/2014 0530   K 4.0 11/24/2021 1241   K 3.4 (L) 01/18/2014 0530   CL 101 11/24/2021 1241   CL 104 01/18/2014 0530   CO2 28 11/24/2021 1241   CO2 25 01/18/2014 0530   BUN 15 11/24/2021 1241   BUN 13 01/18/2014 0530   CREATININE 0.95 11/24/2021 1241   CREATININE 0.69 01/18/2014 0530      Component Value Date/Time   CALCIUM 9.6 11/24/2021 1241   CALCIUM 9.0 01/18/2014 0530   ALKPHOS 80 11/24/2021 1241   ALKPHOS 89 01/15/2014 0654   AST 16 11/24/2021 1241   ALT 12 11/24/2021 1241   ALT 14 01/15/2014 0654   BILITOT 0.4 11/24/2021 1241       RADIOGRAPHIC STUDIES: DG Chest 2 View  Result Date: 11/07/2021 CLINICAL DATA:  LEFT lower lobectomy on 09/18/2021, follow-up EXAM: CHEST - 2 VIEW COMPARISON:  10/03/2021 FINDINGS: Upper normal heart size. Large hiatal hernia. Mediastinal contours and pulmonary vascularity otherwise normal. Lungs clear. No infiltrate, pleural effusion, or pneumothorax. Osseous structures unremarkable. IMPRESSION: No acute abnormalities. Large hiatal hernia. Electronically Signed   By: Lavonia Dana M.D.   On: 11/07/2021 11:10     ASSESSMENT: This is a very pleasant 81 year old Caucasian female never smoker recently diagnosed with stage I non-small cell lung cancer, adenocarcinoma.  She presented with a left lower lobe pulmonary nodule status post resection November 2022 under the care of Dr. Kipp Brood.  The patient was seen with Dr. Julien Nordmann today.  Dr. Julien Nordmann discussed that no adjuvant treatment is indicated in stage I.  Recommended the patient continue on observation with restaging CT scan of the chest and 6 months.  I will arrange for her to have a  restaging CT scan of the chest in 6 months from now.  We will see her back for a follow-up visit a few days later for evaluation to review scan results in 6 months  The patient voices understanding of current disease status and treatment options and is in agreement with the current care plan.  All questions were answered. The patient knows to call the clinic with any problems, questions or concerns. We can certainly see the patient much sooner if necessary.  Thank you so much for allowing me to participate in the care of Gail Salinas. I will continue to follow up the patient with you and assist in her care.  Disclaimer: This note was dictated with voice recognition software. Similar sounding words can inadvertently be transcribed and may not be corrected upon review.   Remington Skalsky L Wilmer Berryhill November 24, 2021, 1:53 PM  ADDENDUM: Hematology/Oncology Attending: I had a face-to-face encounter with the patient today.  I reviewed her record, lab as well as the imaging studies and recommended her care plan. This is a very pleasant 81 years old white female with past medical history significant for anxiety, GERD as well as OCD and prediabetes.  The patient was getting evaluation for suspicious hiatal hernia and CT scan of the chest, abdomen pelvis performed at that time and that showed incidental 1.7 x 1.0 cm subpleural density in the superior segment of the left  lower lobe with pleural tail extending into the left major fissure and left posterior chest wall with some degree of retraction of the left major fissure. The patient was referred to Dr. Valeta Harms and Dr. Kipp Brood and she had a PET scan on July 02, 2021 and that showed no hypermetabolic activity in the left lower lobe nodule but still suspicious for low-grade adenocarcinoma. On September 18, 2021 the patient underwent navigational bronchoscopy with biopsy followed by robotic assisted left lower lobe wedge resection followed by left lower lobectomy.  The final pathology showed 1.2 cm non-small cell lung cancer, adenocarcinoma with negative resection margin and no evidence for pleural or lymphovascular invasion.  The the dissected lymph nodes were negative for malignancy. Dr. Kipp Brood kindly referred the patient to me today for evaluation and recommendation regarding her condition. When seen today she is feeling fine today with no concerning complaints. I had a lengthy discussion with the patient today about her condition and treatment options. I explained to the patient that she has a stage IA (T1b, N0, M0) non-small cell lung cancer, adenocarcinoma.  She had curative treatment for her condition with the surgical resection.  I explained to the patient that there is no survival benefit for adjuvant systemic chemotherapy or radiation for patient with a stage Ia non-small cell lung cancer and the current standard of care is observation. I recommended for the patient to have repeat CT scan of the chest in 6 months. She will come back for follow-up visit at that time. The patient was advised to call immediately if she has any other concerning symptoms in the interval.  The total time spent in the appointment was 60 minutes. Disclaimer: This note was dictated with voice recognition software. Similar sounding words can inadvertently be transcribed and may be missed upon review. Eilleen Kempf,  MD 11/24/21

## 2021-11-24 ENCOUNTER — Inpatient Hospital Stay: Payer: Medicare Other | Attending: Physician Assistant

## 2021-11-24 ENCOUNTER — Other Ambulatory Visit: Payer: Self-pay

## 2021-11-24 ENCOUNTER — Inpatient Hospital Stay (HOSPITAL_BASED_OUTPATIENT_CLINIC_OR_DEPARTMENT_OTHER): Payer: Medicare Other | Admitting: Physician Assistant

## 2021-11-24 ENCOUNTER — Encounter: Payer: Self-pay | Admitting: Physician Assistant

## 2021-11-24 VITALS — BP 153/89 | HR 86 | Temp 97.8°F | Resp 17 | Wt 184.1 lb

## 2021-11-24 DIAGNOSIS — C3432 Malignant neoplasm of lower lobe, left bronchus or lung: Secondary | ICD-10-CM | POA: Diagnosis present

## 2021-11-24 DIAGNOSIS — C3490 Malignant neoplasm of unspecified part of unspecified bronchus or lung: Secondary | ICD-10-CM | POA: Insufficient documentation

## 2021-11-24 DIAGNOSIS — R911 Solitary pulmonary nodule: Secondary | ICD-10-CM

## 2021-11-24 LAB — CBC WITH DIFFERENTIAL (CANCER CENTER ONLY)
Abs Immature Granulocytes: 0.03 10*3/uL (ref 0.00–0.07)
Basophils Absolute: 0.1 10*3/uL (ref 0.0–0.1)
Basophils Relative: 1 %
Eosinophils Absolute: 0.2 10*3/uL (ref 0.0–0.5)
Eosinophils Relative: 2 %
HCT: 40.7 % (ref 36.0–46.0)
Hemoglobin: 13.3 g/dL (ref 12.0–15.0)
Immature Granulocytes: 0 %
Lymphocytes Relative: 24 %
Lymphs Abs: 1.8 10*3/uL (ref 0.7–4.0)
MCH: 28 pg (ref 26.0–34.0)
MCHC: 32.7 g/dL (ref 30.0–36.0)
MCV: 85.7 fL (ref 80.0–100.0)
Monocytes Absolute: 0.7 10*3/uL (ref 0.1–1.0)
Monocytes Relative: 9 %
Neutro Abs: 4.6 10*3/uL (ref 1.7–7.7)
Neutrophils Relative %: 64 %
Platelet Count: 300 10*3/uL (ref 150–400)
RBC: 4.75 MIL/uL (ref 3.87–5.11)
RDW: 13.5 % (ref 11.5–15.5)
WBC Count: 7.4 10*3/uL (ref 4.0–10.5)
nRBC: 0 % (ref 0.0–0.2)

## 2021-11-24 LAB — CMP (CANCER CENTER ONLY)
ALT: 12 U/L (ref 0–44)
AST: 16 U/L (ref 15–41)
Albumin: 4.2 g/dL (ref 3.5–5.0)
Alkaline Phosphatase: 80 U/L (ref 38–126)
Anion gap: 9 (ref 5–15)
BUN: 15 mg/dL (ref 8–23)
CO2: 28 mmol/L (ref 22–32)
Calcium: 9.6 mg/dL (ref 8.9–10.3)
Chloride: 101 mmol/L (ref 98–111)
Creatinine: 0.95 mg/dL (ref 0.44–1.00)
GFR, Estimated: >60 mL/min (ref >60)
Glucose, Bld: 93 mg/dL (ref 70–99)
Potassium: 4 mmol/L (ref 3.5–5.1)
Sodium: 138 mmol/L (ref 135–145)
Total Bilirubin: 0.4 mg/dL (ref 0.3–1.2)
Total Protein: 7.4 g/dL (ref 6.5–8.1)

## 2021-12-23 ENCOUNTER — Other Ambulatory Visit: Payer: Self-pay | Admitting: Family Medicine

## 2021-12-23 DIAGNOSIS — Z1231 Encounter for screening mammogram for malignant neoplasm of breast: Secondary | ICD-10-CM

## 2022-01-05 ENCOUNTER — Ambulatory Visit (INDEPENDENT_AMBULATORY_CARE_PROVIDER_SITE_OTHER): Payer: Medicare Other | Admitting: Pulmonary Disease

## 2022-01-05 ENCOUNTER — Encounter: Payer: Self-pay | Admitting: Pulmonary Disease

## 2022-01-05 ENCOUNTER — Other Ambulatory Visit: Payer: Self-pay

## 2022-01-05 VITALS — BP 134/72 | HR 81 | Temp 98.0°F | Ht 61.0 in | Wt 180.4 lb

## 2022-01-05 DIAGNOSIS — C3492 Malignant neoplasm of unspecified part of left bronchus or lung: Secondary | ICD-10-CM | POA: Diagnosis not present

## 2022-01-05 DIAGNOSIS — Z902 Acquired absence of lung [part of]: Secondary | ICD-10-CM

## 2022-01-05 DIAGNOSIS — J984 Other disorders of lung: Secondary | ICD-10-CM | POA: Diagnosis not present

## 2022-01-05 DIAGNOSIS — R911 Solitary pulmonary nodule: Secondary | ICD-10-CM | POA: Diagnosis not present

## 2022-01-05 NOTE — Patient Instructions (Addendum)
Thank you for visiting Dr. Valeta Harms at [facility]. ?Today we recommend the following: ? ?Follow up imaging scheduled with Dr. Julien Nordmann.  ? ?Return in about 1 year (around 01/06/2023) for with APP or Dr. Valeta Harms. ? ? ? ?Please do your part to reduce the spread of COVID-19.  ? ?

## 2022-01-05 NOTE — Progress Notes (Signed)
Synopsis: Referred in September 2022 for left lower lobe lung nodule by Dion Body, MD  Subjective:   PATIENT ID: Gail Salinas GENDER: female DOB: 09/19/1941, MRN: [id number]  Chief Complaint  Patient presents with   Follow-up    Follow up. Patient has no complaints.     This is a 81 year old female, non-smoker, gastroesophageal reflux, hiatal hernia.  Patient had work-up for shortness of breath which included a CT scan of the chest which found an incidental left lower lobe superior segment part solid, groundglass nodule 17 mm x 10 mm in size.  Patient has no family history of lung cancer.  Father with colon cancer.  Mother with history of breast cancer.  Medical history states father with lung cancer but after further delineation and sounds like his colon cancer reoccurred with metastasis to the lung.  From respiratory standpoint she is doing well.  She has not had pulmonary function tests.  OV 01/05/2022: Here today for follow-up after single anesthetic event.Patient was taken on 09/18/2021 for navigational bronchoscopy.  Patient had fiducial dye marking followed by robotic assisted surgery by Dr. Kipp Brood.  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  Patient's 5 lymph nodes were negative for malignancy.  Patient has subsequently establish care with medical oncology.  And has follow-up imaging ordered.   Past Medical History:  Diagnosis Date   Anxiety    COPD (chronic obstructive pulmonary disease) (HCC)    GERD (gastroesophageal reflux disease)    History of hiatal hernia 2022   OCD (obsessive compulsive disorder)    PONV (postoperative nausea and vomiting)    Nauseated after surgery 40 years ago   Pre-diabetes      Family History  Problem Relation Age of Onset   Breast cancer Mother 46   Coronary artery disease Mother    Stroke Mother    Uterine cancer Mother     Vaginal cancer Mother    Colon cancer Father    Lung cancer Father      Past Surgical History:  Procedure Laterality Date   ABDOMINAL HYSTERECTOMY     BREAST BIOPSY Left 2015   Negative-Fibrocystic changes   BRONCHIAL BRUSHINGS  09/18/2021   Procedure: BRONCHIAL BRUSHINGS;  Surgeon: Garner Nash, DO;  Location: [facility];  Service: Pulmonary;;   BRONCHIAL NEEDLE ASPIRATION BIOPSY  09/18/2021   Procedure: BRONCHIAL NEEDLE ASPIRATION BIOPSIES;  Surgeon: Garner Nash, DO;  Location: [facility];  Service: Pulmonary;;   COLONOSCOPY WITH PROPOFOL N/A 05/19/2018   Procedure: COLONOSCOPY WITH PROPOFOL;  Surgeon: Lollie Sails, MD;  Location: [facility];  Service: Endoscopy;  Laterality: N/A;   FIDUCIAL MARKER PLACEMENT  09/18/2021   Procedure: FIDUCIAL DYE MARKING;  Surgeon: Garner Nash, DO;  Location: [facility];  Service: Pulmonary;;   INTERCOSTAL NERVE BLOCK  09/18/2021   Procedure: INTERCOSTAL NERVE BLOCK;  Surgeon: Lajuana Matte, MD;  Location: [facility];  Service: Thoracic;;   LOBECTOMY  09/18/2021   Procedure: LEFT LOWER LOBECTOMY;  Surgeon: Lajuana Matte, MD;  Location: [facility];  Service: Thoracic;;   LYMPH NODE DISSECTION  09/18/2021   Procedure: LYMPH NODE DISSECTION;  Surgeon: Lajuana Matte, MD;  Location: [facility];  Service: Thoracic;;   OOPHORECTOMY     TONSILLECTOMY     TUMOR EXCISION Left    pt states she had a 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 50+ years  ago   [place] Left 09/18/2021   Procedure: VIDEO BRONCHOSCOPY WITH ENDOBRONCHIAL NAVIGATION;  Surgeon: Garner Nash, DO;  Location: [facility];  Service: Pulmonary;  Laterality: Left;  ION w/ MB:ICG Fiducial Dye Marking    Social History   Socioeconomic History   Marital status: Married    Spouse name: Not on file   Number of children: Not on file   Years of education: Not on file   Highest education level: Not on file   Occupational History   Not on file  Tobacco Use   Smoking status: Never   Smokeless tobacco: Never  Vaping Use   Vaping Use: Never used  Substance and Sexual Activity   Alcohol use: Not Currently    Comment: very rarely   Drug use: Never   Sexual activity: Not on file  Other Topics Concern   Not on file  Social History Narrative   Not on file   Social Determinants of Health   Financial Resource Strain: Not on file  Food Insecurity: Not on file  Transportation Needs: Not on file  Physical Activity: Not on file  Stress: Not on file  Social Connections: Not on file  Intimate Partner Violence: Not on file     Allergies  Allergen Reactions   Advair Diskus [Fluticasone-Salmeterol]     Panic attack   Codeine Nausea And Vomiting   Prednisone     Panic attack     Outpatient Medications Prior to Visit  Medication Sig Dispense Refill   aspirin EC 81 MG tablet Take 81 mg by mouth daily.     calcium carbonate (OSCAL) 1500 (600 Ca) MG TABS tablet Take 1,500 mg by mouth 2 (two) times daily with a meal.     DULoxetine (CYMBALTA) 30 MG capsule Take 30 mg by mouth daily.     magnesium gluconate (MAGONATE) 500 MG tablet Take 500 mg by mouth 2 (two) times daily.     montelukast (SINGULAIR) 10 MG tablet Take 10 mg by mouth at bedtime.     omeprazole (PRILOSEC) 20 MG capsule Take 20 mg by mouth daily.     potassium chloride SA (KLOR-CON) 20 MEQ tablet Take 20 mEq by mouth 4 (four) times daily.     traMADol (ULTRAM) 50 MG tablet Take 1 tablet (50 mg total) by mouth every 6 (six) hours as needed for moderate pain. 30 tablet 0   No facility-administered medications prior to visit.    Review of Systems  Constitutional:  Negative for chills, fever, malaise/fatigue and weight loss.  HENT:  Negative for hearing loss, sore throat and tinnitus.   Eyes:  Negative for blurred vision and double vision.  Respiratory:  Positive for shortness of breath. Negative for cough, hemoptysis, sputum  production, wheezing and stridor.   Cardiovascular:  Negative for chest pain, palpitations, orthopnea, leg swelling and PND.  Gastrointestinal:  Negative for abdominal pain, constipation, diarrhea, heartburn, nausea and vomiting.  Genitourinary:  Negative for dysuria, hematuria and urgency.  Musculoskeletal:  Negative for joint pain and myalgias.  Skin:  Negative for itching and rash.  Neurological:  Negative for dizziness, tingling, weakness and headaches.  Endo/Heme/Allergies:  Negative for environmental allergies. Does not bruise/bleed easily.  Psychiatric/Behavioral:  Negative for depression. The patient is not nervous/anxious and does not have insomnia.   All other systems reviewed and are negative.   Objective:  Physical Exam Vitals reviewed.  Constitutional:      General: She is not in acute  distress.    Appearance: She is well-developed. She is obese.  HENT:     Head: Normocephalic and atraumatic.  Eyes:     General: No scleral icterus.    Conjunctiva/sclera: Conjunctivae normal.     Pupils: Pupils are equal, round, and reactive to light.  Neck:     Vascular: No JVD.     Trachea: No tracheal deviation.  Cardiovascular:     Rate and Rhythm: Normal rate and regular rhythm.     Heart sounds: Normal heart sounds. No murmur heard. Pulmonary:     Effort: Pulmonary effort is normal. No tachypnea, accessory muscle usage or respiratory distress.     Breath sounds: Normal breath sounds. No stridor. No wheezing, rhonchi or rales.  Abdominal:     General: Bowel sounds are normal. There is no distension.     Palpations: Abdomen is soft.     Tenderness: There is no abdominal tenderness.  Musculoskeletal:        General: No tenderness.     Cervical back: Neck supple.  Lymphadenopathy:     Cervical: No cervical adenopathy.  Skin:    General: Skin is warm and dry.     Capillary Refill: Capillary refill takes less than 2 seconds.     Findings: No rash.  Neurological:     Mental  Status: She is alert and oriented to person, place, and time.  Psychiatric:        Behavior: Behavior normal.     Vitals:   01/05/22 1139  BP: 134/72  Pulse: 81  Temp: 98 F (36.7 C)  TempSrc: Oral  SpO2: 98%  Weight: 180 lb 6.4 oz (81.8 kg)  Height: 5\' 1"  (1.549 m)   98% on RA BMI Readings from Last 3 Encounters:  01/05/22 34.09 kg/m  11/24/21 33.68 kg/m  11/07/21 32.92 kg/m   Wt Readings from Last 3 Encounters:  01/05/22 180 lb 6.4 oz (81.8 kg)  11/24/21 184 lb 2 oz (83.5 kg)  11/07/21 180 lb (81.6 kg)     CBC    Component Value Date/Time   WBC 7.4 11/24/2021 1241   WBC 12.7 (H) 09/20/2021 0053   RBC 4.75 11/24/2021 1241   HGB 13.3 11/24/2021 1241   HGB 11.6 (L) 01/17/2014 0519   HCT 40.7 11/24/2021 1241   HCT 34.1 (L) 01/17/2014 0519   PLT 300 11/24/2021 1241   PLT 315 01/17/2014 0519   MCV 85.7 11/24/2021 1241   MCV 84 01/17/2014 0519   MCH 28.0 11/24/2021 1241   MCHC 32.7 11/24/2021 1241   RDW 13.5 11/24/2021 1241   RDW 14.6 (H) 01/17/2014 0519   LYMPHSABS 1.8 11/24/2021 1241   LYMPHSABS 1.0 01/17/2014 0519   MONOABS 0.7 11/24/2021 1241   MONOABS 0.6 01/17/2014 0519   EOSABS 0.2 11/24/2021 1241   EOSABS 0.0 01/17/2014 0519   BASOSABS 0.1 11/24/2021 1241   BASOSABS 0.0 01/17/2014 0519    Chest Imaging: 06/12/2021 CT chest groundglass subsolid 17 x 10 mm nodule within the left lower lobe superior segment The patient's images have been independently reviewed by me.   07/02/2021 nuclear medicine PET scan: Low-level uptake within left lower lobe lesion. The patient's images have been independently reviewed by me.    Pulmonary Functions Testing Results: PFT Results Latest Ref Rng & Units 08/06/2021  FVC-Pre L 2.26  FVC-Predicted Pre % 87  Pre FEV1/FVC % % 79  FEV1-Pre L 1.78  FEV1-Predicted Pre % 92  DLCO uncorrected ml/min/mmHg 14.22  DLCO UNC% %  77  DLCO corrected ml/min/mmHg 14.22  DLCO COR %Predicted % 77  DLVA Predicted % 93  TLC L  4.35  TLC % Predicted % 88  RV % Predicted % 86    FeNO:   Pathology:   Echocardiogram:   Heart Catheterization:     Assessment & Plan:     ICD-10-CM   1. Adenocarcinoma of lung, stage 1, left (HCC)  C34.92     2. Lung nodule  R91.1     3. Pulmonary lesion  J98.4     4. Left lower lobe pulmonary nodule  R91.1     5. S/P lobectomy of lung  Z90.2       Discussion:  This is a 81 year old female, non-smoker found to have an incidental subsolid 17 mm x 10 mm groundglass lesion concerning for malignancy.  Decision was made for single anesthetic event with thoracic surgery, patient was diagnosed with a invasive adenocarcinoma of the lung, with successful resection and negative nodes.  Plan: Continue follow-up with medical oncology as scheduled for surveillance imaging. She has an appointment to see Dr. Kipp Brood soon to discuss her hiatal hernia. From a pulmonary standpoint she can continue to follow with [place] as needed or see [place] again in 1 year.    Current Outpatient Medications:    aspirin EC 81 MG tablet, Take 81 mg by mouth daily., Disp: , Rfl:    calcium carbonate (OSCAL) 1500 (600 Ca) MG TABS tablet, Take 1,500 mg by mouth 2 (two) times daily with a meal., Disp: , Rfl:    DULoxetine (CYMBALTA) 30 MG capsule, Take 30 mg by mouth daily., Disp: , Rfl:    magnesium gluconate (MAGONATE) 500 MG tablet, Take 500 mg by mouth 2 (two) times daily., Disp: , Rfl:    montelukast (SINGULAIR) 10 MG tablet, Take 10 mg by mouth at bedtime., Disp: , Rfl:    omeprazole (PRILOSEC) 20 MG capsule, Take 20 mg by mouth daily., Disp: , Rfl:    potassium chloride SA (KLOR-CON) 20 MEQ tablet, Take 20 mEq by mouth 4 (four) times daily., Disp: , Rfl:    traMADol (ULTRAM) 50 MG tablet, Take 1 tablet (50 mg total) by mouth every 6 (six) hours as needed for moderate pain., Disp: 30 tablet, Rfl: 0   Garner Nash, DO [facility] 01/05/2022 12:07 PM

## 2022-01-16 ENCOUNTER — Encounter: Payer: Medicare Other | Admitting: Thoracic Surgery (Cardiothoracic Vascular Surgery)

## 2022-01-23 ENCOUNTER — Ambulatory Visit (INDEPENDENT_AMBULATORY_CARE_PROVIDER_SITE_OTHER): Payer: Medicare Other | Admitting: Thoracic Surgery (Cardiothoracic Vascular Surgery)

## 2022-01-23 ENCOUNTER — Other Ambulatory Visit: Payer: Self-pay

## 2022-01-23 ENCOUNTER — Other Ambulatory Visit: Payer: Self-pay | Admitting: *Deleted

## 2022-01-23 VITALS — BP 165/70 | HR 90 | Resp 20 | Ht 61.0 in | Wt 180.0 lb

## 2022-01-23 DIAGNOSIS — Z09 Encounter for follow-up examination after completed treatment for conditions other than malignant neoplasm: Secondary | ICD-10-CM | POA: Diagnosis not present

## 2022-01-23 DIAGNOSIS — C3492 Malignant neoplasm of unspecified part of left bronchus or lung: Secondary | ICD-10-CM | POA: Diagnosis not present

## 2022-01-23 DIAGNOSIS — K449 Diaphragmatic hernia without obstruction or gangrene: Secondary | ICD-10-CM

## 2022-01-23 NOTE — H&P (View-Only) (Signed)
? ?   ?[address] ?      [place] [address] ?            [phone]   ?                 ?[place] [facility] Record #[id number] ?Date of Birth: 06/12/1941 ? ?Referring: Garner Nash, DO ?Primary Care: Dion Body, MD ?Primary Cardiologist: None ? ?Chief Complaint:    ?Chief Complaint  ?Patient presents with  ? Routine Post Op  ?  3 month f/u  ? ? ?History of Present Illness:    ?Gail Salinas 81 y.o. female presents in follow-up.  She underwent a robotic assisted left lower lobectomy for non-small cell lung cancer in November 2022.  She had a known history of a hiatal hernia.  She has recovered well from her lung surgery.  She has been quite symptomatic from her hiatal hernia for several years as well.  She admits to dysphagia and severe reflux.  She also admits to some odynophagia with meals and with different positions. ? ? ?  ? ? ? ?Past Medical History:  ?Diagnosis Date  ? Anxiety   ? COPD (chronic obstructive pulmonary disease) ([facility])   ? GERD (gastroesophageal reflux disease)   ? History of hiatal hernia 2022  ? OCD (obsessive compulsive disorder)   ? PONV (postoperative nausea and vomiting)   ? Nauseated after surgery 40 years ago  ? Pre-diabetes   ? ? ?Past Surgical History:  ?Procedure Laterality Date  ? ABDOMINAL HYSTERECTOMY    ? BREAST BIOPSY Left 2015  ? Negative-Fibrocystic changes  ? BRONCHIAL BRUSHINGS  09/18/2021  ? Procedure: BRONCHIAL BRUSHINGS;  Surgeon: Garner Nash, DO;  Location: [facility];  Service: Pulmonary;;  ? BRONCHIAL NEEDLE ASPIRATION BIOPSY  09/18/2021  ? Procedure: BRONCHIAL NEEDLE ASPIRATION BIOPSIES;  Surgeon: Garner Nash, DO;  Location: [facility];  Service: Pulmonary;;  ? COLONOSCOPY WITH PROPOFOL N/A 05/19/2018  ? Procedure: COLONOSCOPY WITH PROPOFOL;  Surgeon: Lollie Sails, MD;  Location: [facility];  Service: Endoscopy;  Laterality: N/A;  ? FIDUCIAL MARKER PLACEMENT  09/18/2021  ? Procedure: FIDUCIAL  DYE MARKING;  Surgeon: Garner Nash, DO;  Location: [facility];  Service: Pulmonary;;  ? INTERCOSTAL NERVE BLOCK  09/18/2021  ? Procedure: INTERCOSTAL NERVE BLOCK;  Surgeon: Lajuana Matte, MD;  Location: [facility];  Service: Thoracic;;  ? LOBECTOMY  09/18/2021  ? Procedure: LEFT LOWER LOBECTOMY;  Surgeon: Lajuana Matte, MD;  Location: [facility];  Service: Thoracic;;  ? LYMPH NODE DISSECTION  09/18/2021  ? Procedure: LYMPH NODE DISSECTION;  Surgeon: Lajuana Matte, MD;  Location: [facility];  Service: Thoracic;;  ? OOPHORECTOMY    ? TONSILLECTOMY    ? TUMOR EXCISION Left   ? 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 VIDEO BRONCHOSCOPY WITH ENDOBRONCHIAL NAVIGATION Left 09/18/2021  ? Procedure: VIDEO BRONCHOSCOPY WITH ENDOBRONCHIAL NAVIGATION;  Surgeon: Garner Nash, DO;  Location: [facility];  Service: Pulmonary;  Laterality: Left;  ION w/ MB:ICG Fiducial Dye Marking  ? ? ?Family History  ?Problem Relation Age of Onset  ? Breast cancer Mother 28  ? Coronary artery disease Mother   ? Stroke Mother   ? Uterine cancer Mother   ? Vaginal cancer Mother   ? Colon cancer Father   ? Lung cancer Father   ? ? ? ?Social History  ? ?  Tobacco Use  ?Smoking Status Never  ?Smokeless Tobacco Never  ?  ?Social History  ? ?Substance and Sexual Activity  ?Alcohol Use Not Currently  ? Comment: very rarely  ? ? ? ?Allergies  ?Allergen Reactions  ? Advair Diskus [Fluticasone-Salmeterol]   ?  Panic attack  ? Codeine Nausea And Vomiting  ? Prednisone   ?  Panic attack  ? ? ?Current Outpatient Medications  ?Medication Sig Dispense Refill  ? aspirin EC 81 MG tablet Take 81 mg by mouth daily.    ? calcium carbonate (OSCAL) 1500 (600 Ca) MG TABS tablet Take 1,500 mg by mouth 2 (two) times daily with a meal.    ? DULoxetine (CYMBALTA) 30 MG capsule Take 30 mg by mouth daily.    ? magnesium gluconate (MAGONATE) 500 MG tablet Take 500 mg by mouth 2 (two) times daily.    ? montelukast (SINGULAIR)  10 MG tablet Take 10 mg by mouth at bedtime.    ? omeprazole (PRILOSEC) 20 MG capsule Take 20 mg by mouth daily.    ? potassium chloride SA (KLOR-CON) 20 MEQ tablet Take 20 mEq by mouth 4 (four) times daily.    ? traMADol (ULTRAM) 50 MG tablet Take 1 tablet (50 mg total) by mouth every 6 (six) hours as needed for moderate pain. 30 tablet 0  ? ?No current facility-administered medications for this visit.  ? ? ?Review of Systems  ?Constitutional:  Negative for malaise/fatigue.  ?Respiratory:  Negative for cough and shortness of breath.   ?Gastrointestinal:  Positive for abdominal pain and heartburn.  ? ? ?PHYSICAL EXAMINATION: ?BP (!) 165/70   Pulse 90   Resp 20   Ht 5\' 1"  (1.549 m)   Wt 180 lb (81.6 kg)   SpO2 98% Comment: RA  BMI 34.01 kg/m?  ?Physical Exam ?Constitutional:   ?   Appearance: Normal appearance. She is normal weight.  ?Cardiovascular:  ?   Rate and Rhythm: Normal rate.  ?Pulmonary:  ?   Effort: Pulmonary effort is normal. No respiratory distress.  ?Abdominal:  ?   General: Abdomen is flat. There is no distension.  ?Musculoskeletal:     ?   General: Normal range of motion.  ?   Cervical back: Normal range of motion.  ?Skin: ?   General: Skin is warm and dry.  ?Neurological:  ?   General: No focal deficit present.  ?   Mental Status: She is alert and oriented to person, place, and time.  ? ? ?Diagnostic Studies & Laboratory data: ?   ?CT Scan:  ? ?  ?I have independently reviewed the above radiology studies  and reviewed the findings with the patient.  ? ?Recent Lab Findings: ?Lab Results  ?Component Value Date  ? WBC 7.4 11/24/2021  ? HGB 13.3 11/24/2021  ? HCT 40.7 11/24/2021  ? PLT 300 11/24/2021  ? GLUCOSE 93 11/24/2021  ? ALT 12 11/24/2021  ? AST 16 11/24/2021  ? NA 138 11/24/2021  ? K 4.0 11/24/2021  ? CL 101 11/24/2021  ? CREATININE 0.95 11/24/2021  ? BUN 15 11/24/2021  ? CO2 28 11/24/2021  ? INR 0.9 09/16/2021  ? HGBA1C 5.4 09/16/2021  ? ? ? ? ?Assessment / Plan:   ?81 year old female with  history of left lower lobe non-small cell lung cancer status post lobectomy in November 2022 who also has a large paraesophageal hernia.  She is symptomatic from this and is agreeable to proceed with an upper endoscopy and robotic assisted  paraesophageal hernia repair with fundoplication.  Given her lower lobectomy she likely will have some scar tissue to the left pleural space this care will need to be taken while mobilizing the hernia sac.  She is tentatively scheduled for January 28, 2022. ?   ? ?I  spent 20 minutes with the patient face to face counseling and coordination of care.   ? ?Lajuana Matte ?01/23/2022 3:10 PM ? ? ? ? ? ? ? ?

## 2022-01-23 NOTE — Progress Notes (Signed)
? ?   ?[address] ?      [place] [address] ?            [phone]   ?                 ?[facility] Record #[id number] ?Date of Birth: 11/17/1940 ? ?Referring: Garner Nash, DO ?Primary Care: Dion Body, MD ?Primary Cardiologist: None ? ?Chief Complaint:    ?Chief Complaint  ?Patient presents with  ? Routine Post Op  ?  3 month f/u  ? ? ?History of Present Illness:    ?Gail Salinas 81 y.o. female presents in follow-up.  She underwent a robotic assisted left lower lobectomy for non-small cell lung cancer in November 2022.  She had a known history of a hiatal hernia.  She has recovered well from her lung surgery.  She has been quite symptomatic from her hiatal hernia for several years as well.  She admits to dysphagia and severe reflux.  She also admits to some odynophagia with meals and with different positions. ? ? ?  ? ? ? ?Past Medical History:  ?Diagnosis Date  ? Anxiety   ? COPD (chronic obstructive pulmonary disease) ([place])   ? GERD (gastroesophageal reflux disease)   ? History of hiatal hernia 2022  ? OCD (obsessive compulsive disorder)   ? PONV (postoperative nausea and vomiting)   ? Nauseated after surgery 40 years ago  ? Pre-diabetes   ? ? ?Past Surgical History:  ?Procedure Laterality Date  ? ABDOMINAL HYSTERECTOMY    ? BREAST BIOPSY Left 2015  ? Negative-Fibrocystic changes  ? BRONCHIAL BRUSHINGS  09/18/2021  ? Procedure: BRONCHIAL BRUSHINGS;  Surgeon: Garner Nash, DO;  Location: [facility];  Service: Pulmonary;;  ? BRONCHIAL NEEDLE ASPIRATION BIOPSY  09/18/2021  ? Procedure: BRONCHIAL NEEDLE ASPIRATION BIOPSIES;  Surgeon: Garner Nash, DO;  Location: [facility];  Service: Pulmonary;;  ? COLONOSCOPY WITH PROPOFOL N/A 05/19/2018  ? Procedure: COLONOSCOPY WITH PROPOFOL;  Surgeon: Lollie Sails, MD;  Location: [facility];  Service: Endoscopy;  Laterality: N/A;  ? FIDUCIAL MARKER PLACEMENT  09/18/2021  ? Procedure: FIDUCIAL  DYE MARKING;  Surgeon: Garner Nash, DO;  Location: [facility];  Service: Pulmonary;;  ? INTERCOSTAL NERVE BLOCK  09/18/2021  ? Procedure: INTERCOSTAL NERVE BLOCK;  Surgeon: Lajuana Matte, MD;  Location: [facility];  Service: Thoracic;;  ? LOBECTOMY  09/18/2021  ? Procedure: LEFT LOWER LOBECTOMY;  Surgeon: Lajuana Matte, MD;  Location: [facility];  Service: Thoracic;;  ? LYMPH NODE DISSECTION  09/18/2021  ? Procedure: LYMPH NODE DISSECTION;  Surgeon: Lajuana Matte, MD;  Location: [facility];  Service: Thoracic;;  ? OOPHORECTOMY    ? TONSILLECTOMY    ? TUMOR EXCISION Left   ? 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 VIDEO BRONCHOSCOPY WITH ENDOBRONCHIAL NAVIGATION Left 09/18/2021  ? Procedure: VIDEO BRONCHOSCOPY WITH ENDOBRONCHIAL NAVIGATION;  Surgeon: Garner Nash, DO;  Location: [facility];  Service: Pulmonary;  Laterality: Left;  ION w/ MB:ICG Fiducial Dye Marking  ? ? ?Family History  ?Problem Relation Age of Onset  ? Breast cancer Mother 59  ? Coronary artery disease Mother   ? Stroke Mother   ? Uterine cancer Mother   ? Vaginal cancer Mother   ? Colon cancer Father   ? Lung cancer Father   ? ? ? ?Social History  ? ?  Tobacco Use  ?Smoking Status Never  ?Smokeless Tobacco Never  ?  ?Social History  ? ?Substance and Sexual Activity  ?Alcohol Use Not Currently  ? Comment: very rarely  ? ? ? ?Allergies  ?Allergen Reactions  ? Advair Diskus [Fluticasone-Salmeterol]   ?  Panic attack  ? Codeine Nausea And Vomiting  ? Prednisone   ?  Panic attack  ? ? ?Current Outpatient Medications  ?Medication Sig Dispense Refill  ? aspirin EC 81 MG tablet Take 81 mg by mouth daily.    ? calcium carbonate (OSCAL) 1500 (600 Ca) MG TABS tablet Take 1,500 mg by mouth 2 (two) times daily with a meal.    ? DULoxetine (CYMBALTA) 30 MG capsule Take 30 mg by mouth daily.    ? magnesium gluconate (MAGONATE) 500 MG tablet Take 500 mg by mouth 2 (two) times daily.    ? montelukast (SINGULAIR)  10 MG tablet Take 10 mg by mouth at bedtime.    ? omeprazole (PRILOSEC) 20 MG capsule Take 20 mg by mouth daily.    ? potassium chloride SA (KLOR-CON) 20 MEQ tablet Take 20 mEq by mouth 4 (four) times daily.    ? traMADol (ULTRAM) 50 MG tablet Take 1 tablet (50 mg total) by mouth every 6 (six) hours as needed for moderate pain. 30 tablet 0  ? ?No current facility-administered medications for this visit.  ? ? ?Review of Systems  ?Constitutional:  Negative for malaise/fatigue.  ?Respiratory:  Negative for cough and shortness of breath.   ?Gastrointestinal:  Positive for abdominal pain and heartburn.  ? ? ?PHYSICAL EXAMINATION: ?BP (!) 165/70   Pulse 90   Resp 20   Ht 5\' 1"  (1.549 m)   Wt 180 lb (81.6 kg)   SpO2 98% Comment: RA  BMI 34.01 kg/m?  ?Physical Exam ?Constitutional:   ?   Appearance: Normal appearance. She is normal weight.  ?Cardiovascular:  ?   Rate and Rhythm: Normal rate.  ?Pulmonary:  ?   Effort: Pulmonary effort is normal. No respiratory distress.  ?Abdominal:  ?   General: Abdomen is flat. There is no distension.  ?Musculoskeletal:     ?   General: Normal range of motion.  ?   Cervical back: Normal range of motion.  ?Skin: ?   General: Skin is warm and dry.  ?Neurological:  ?   General: No focal deficit present.  ?   Mental Status: She is alert and oriented to person, place, and time.  ? ? ?Diagnostic Studies & Laboratory data: ?   ?CT Scan:  ? ?  ?I have independently reviewed the above radiology studies  and reviewed the findings with the patient.  ? ?Recent Lab Findings: ?Lab Results  ?Component Value Date  ? WBC 7.4 11/24/2021  ? HGB 13.3 11/24/2021  ? HCT 40.7 11/24/2021  ? PLT 300 11/24/2021  ? GLUCOSE 93 11/24/2021  ? ALT 12 11/24/2021  ? AST 16 11/24/2021  ? NA 138 11/24/2021  ? K 4.0 11/24/2021  ? CL 101 11/24/2021  ? CREATININE 0.95 11/24/2021  ? BUN 15 11/24/2021  ? CO2 28 11/24/2021  ? INR 0.9 09/16/2021  ? HGBA1C 5.4 09/16/2021  ? ? ? ? ?Assessment / Plan:   ?81 year old female with  history of left lower lobe non-small cell lung cancer status post lobectomy in November 2022 who also has a large paraesophageal hernia.  She is symptomatic from this and is agreeable to proceed with an upper endoscopy and robotic assisted  paraesophageal hernia repair with fundoplication.  Given her lower lobectomy she likely will have some scar tissue to the left pleural space this care will need to be taken while mobilizing the hernia sac.  She is tentatively scheduled for January 28, 2022. ?   ? ?I  spent 20 minutes with the patient face to face counseling and coordination of care.   ? ?Lajuana Matte ?01/23/2022 3:10 PM ? ? ? ? ? ? ? ?

## 2022-01-26 NOTE — Pre-Procedure Instructions (Signed)
Surgical Instructions ? ? ? Your procedure is scheduled on Wednesday, March 29th. ? Report to [facility] Main Entrance "A" at 7:45 A.M., then check in with the Admitting office. ? Call this number if you have problems the morning of surgery: ? [phone] ? ? If you have any questions prior to your surgery date call [phone]: Open Monday-Friday 8am-4pm ? ? ? Remember: ? Do not eat or drink after midnight the night before your surgery ? ?  ? Take these medicines the morning of surgery with A SIP OF WATER:  ?DULoxetine (CYMBALTA)  ?omeprazole (PRILOSEC)  ? ?acetaminophen (TYLENOL)  ?meclizine (ANTIVERT)  ?traMADol Veatrice Bourbon)  ? ?Follow your surgeon's instructions on when to stop Aspirin.  If no instructions were given by your surgeon then you will need to call the office to get those instructions.   ? ?As of today, STOP taking any Aleve, Naproxen, Ibuprofen, Motrin, Advil, Goody's, BC's, all herbal medications, fish oil, and all vitamins. ? ?         ?Do not wear jewelry or makeup ?Do not wear lotions, powders, perfumes, or deodorant. ?Do not shave 48 hours prior to surgery.  ?Do not bring valuables to the hospital. ?Do not wear nail polish, gel polish, artificial nails, or any other type of covering on natural nails (fingers and toes) ?If you have artificial nails or gel coating that need to be removed by a nail salon, please have this removed prior to surgery. Artificial nails or gel coating may interfere with anesthesia's ability to adequately monitor your vital signs. ? ?[facility] is not responsible for any belongings or valuables. .  ? ?Do NOT Smoke (Tobacco/Vaping)  24 hours prior to your procedure ? ?If you use a CPAP at night, you may bring your mask for your overnight stay. ?  ?Contacts, glasses, hearing aids, dentures or partials may not be worn into surgery, please bring cases for these belongings ?  ?For patients admitted to the hospital, discharge time will be determined by your treatment team. ?   ?Patients discharged the day of surgery will not be allowed to drive home, and someone needs to stay with them for 24 hours. ? ? ?SURGICAL WAITING ROOM VISITATION ?Patients having surgery or a procedure in a hospital may have two support people. ?Children under the age of 56 must have an adult with them who is not the patient. ?They may stay in the waiting area during the procedure and may switch out with other visitors. If the patient needs to stay at the hospital during part of their recovery, the visitor guidelines for inpatient rooms apply. ? ?Please refer to the [facility] website for the visitor guidelines for Inpatients (after your surgery is over and you are in a regular room).  ? ? ? ? ? ?Special instructions:   ? ?Oral Hygiene is also important to reduce your risk of infection.  Remember - BRUSH YOUR TEETH THE MORNING OF SURGERY WITH YOUR REGULAR TOOTHPASTE ? ? ?[facility]- Preparing For Surgery ? ?Before surgery, you can play an important role. Because skin is not sterile, your skin needs to be as free of germs as possible. You can reduce the number of germs on your skin by washing with CHG (chlorahexidine gluconate) Soap before surgery.  CHG is an antiseptic cleaner which kills germs and bonds with the skin to continue killing germs even after washing.   ? ? ?Please do not use if you have an allergy to CHG or antibacterial soaps.  If your skin becomes reddened/irritated stop using the CHG.  ?Do not shave (including legs and underarms) for at least 48 hours prior to first CHG shower. It is OK to shave your face. ? ?Please follow these instructions carefully. ?  ? ? Shower the NIGHT BEFORE SURGERY and the MORNING OF SURGERY with CHG Soap.  ? If you chose to wash your hair, wash your hair first as usual with your normal shampoo. After you shampoo, rinse your hair and body thoroughly to remove the shampoo.  Then [employer] and genitals (private parts) with your normal soap and rinse thoroughly to remove  soap. ? ?After that Use CHG Soap as you would any other liquid soap. You can apply CHG directly to the skin and wash gently with a scrungie or a clean washcloth.  ? ?Apply the CHG Soap to your body ONLY FROM THE NECK DOWN.  Do not use on open wounds or open sores. Avoid contact with your eyes, ears, mouth and genitals (private parts). Wash Face and genitals (private parts)  with your normal soap.  ? ?Wash thoroughly, paying special attention to the area where your surgery will be performed. ? ?Thoroughly rinse your body with warm water from the neck down. ? ?DO NOT shower/wash with your normal soap after using and rinsing off the CHG Soap. ? ?Pat yourself dry with a CLEAN TOWEL. ? ?Wear CLEAN PAJAMAS to bed the night before surgery ? ?Place CLEAN SHEETS on your bed the night before your surgery ? ?DO NOT SLEEP WITH PETS. ? ? ?Day of Surgery: ? ?Take a shower with CHG soap. ?Wear Clean/Comfortable clothing the morning of surgery ?Do not apply any deodorants/lotions.   ?Remember to brush your teeth WITH YOUR REGULAR TOOTHPASTE. ? ? ? ?If you received a COVID test during your pre-op visit  it is requested that you wear a mask when out in public, stay away from anyone that may not be feeling well and notify your surgeon if you develop symptoms. If you have been in contact with anyone that has tested positive in the last 10 days please notify you surgeon. ? ?  ?Please read over the following fact sheets that you were given.  ? ?

## 2022-01-27 ENCOUNTER — Encounter (HOSPITAL_COMMUNITY)
Admission: RE | Admit: 2022-01-27 | Discharge: 2022-01-27 | Disposition: A | Discharge status: Home / Self Care | Payer: Medicare Other | Source: Ambulatory Visit | Attending: Thoracic Surgery (Cardiothoracic Vascular Surgery) | Admitting: Thoracic Surgery (Cardiothoracic Vascular Surgery)

## 2022-01-27 ENCOUNTER — Encounter (HOSPITAL_COMMUNITY): Payer: Self-pay

## 2022-01-27 ENCOUNTER — Ambulatory Visit (HOSPITAL_COMMUNITY)
Admission: RE | Admit: 2022-01-27 | Discharge: 2022-01-27 | Disposition: A | Discharge status: Home / Self Care | Payer: Medicare Other | Source: Ambulatory Visit | Attending: Thoracic Surgery (Cardiothoracic Vascular Surgery) | Admitting: Thoracic Surgery (Cardiothoracic Vascular Surgery)

## 2022-01-27 ENCOUNTER — Other Ambulatory Visit: Payer: Self-pay

## 2022-01-27 VITALS — BP 133/79 | HR 79 | Temp 98.1°F | Resp 17 | Ht 61.0 in | Wt 181.1 lb

## 2022-01-27 DIAGNOSIS — J449 Chronic obstructive pulmonary disease, unspecified: Secondary | ICD-10-CM | POA: Insufficient documentation

## 2022-01-27 DIAGNOSIS — Z01818 Encounter for other preprocedural examination: Secondary | ICD-10-CM | POA: Insufficient documentation

## 2022-01-27 DIAGNOSIS — K449 Diaphragmatic hernia without obstruction or gangrene: Secondary | ICD-10-CM

## 2022-01-27 DIAGNOSIS — Z20822 Contact with and (suspected) exposure to covid-19: Secondary | ICD-10-CM | POA: Insufficient documentation

## 2022-01-27 DIAGNOSIS — K219 Gastro-esophageal reflux disease without esophagitis: Secondary | ICD-10-CM | POA: Insufficient documentation

## 2022-01-27 LAB — COMPREHENSIVE METABOLIC PANEL
ALT: 13 U/L (ref 0–44)
AST: 18 U/L (ref 15–41)
Albumin: 3.9 g/dL (ref 3.5–5.0)
Alkaline Phosphatase: 67 U/L (ref 38–126)
Anion gap: 10 (ref 5–15)
BUN: 21 mg/dL (ref 8–23)
CO2: 23 mmol/L (ref 22–32)
Calcium: 9.4 mg/dL (ref 8.9–10.3)
Chloride: 104 mmol/L (ref 98–111)
Creatinine, Ser: 0.83 mg/dL (ref 0.44–1.00)
GFR, Estimated: >60 mL/min (ref >60)
Glucose, Bld: 97 mg/dL (ref 70–99)
Potassium: 4.2 mmol/L (ref 3.5–5.1)
Sodium: 137 mmol/L (ref 135–145)
Total Bilirubin: 0.4 mg/dL (ref 0.3–1.2)
Total Protein: 6.9 g/dL (ref 6.5–8.1)

## 2022-01-27 LAB — CBC
HCT: 40.9 % (ref 36.0–46.0)
Hemoglobin: 13.5 g/dL (ref 12.0–15.0)
MCH: 28 pg (ref 26.0–34.0)
MCHC: 33 g/dL (ref 30.0–36.0)
MCV: 84.9 fL (ref 80.0–100.0)
Platelets: 331 10*3/uL (ref 150–400)
RBC: 4.82 MIL/uL (ref 3.87–5.11)
RDW: 13.9 % (ref 11.5–15.5)
WBC: 8.8 10*3/uL (ref 4.0–10.5)
nRBC: 0 % (ref 0.0–0.2)

## 2022-01-27 LAB — URINALYSIS, ROUTINE W REFLEX MICROSCOPIC
Bilirubin Urine: NEGATIVE
Glucose, UA: NEGATIVE mg/dL
Hgb urine dipstick: NEGATIVE
Ketones, ur: NEGATIVE mg/dL
Nitrite: NEGATIVE
Protein, ur: NEGATIVE mg/dL
Specific Gravity, Urine: 1.014 (ref 1.005–1.030)
pH: 8 (ref 5.0–8.0)

## 2022-01-27 LAB — TYPE AND SCREEN
ABO/RH(D): A POS
Antibody Screen: NEGATIVE

## 2022-01-27 LAB — BLOOD GAS, ARTERIAL
Acid-Base Excess: 2.2 mmol/L — ABNORMAL HIGH (ref 0.0–2.0)
Bicarbonate: 25.3 mmol/L (ref 20.0–28.0)
Drawn by: 53845
O2 Saturation: 98.7 %
Patient temperature: 37
pCO2 arterial: 34 mmHg (ref 32–48)
pH, Arterial: 7.48 — ABNORMAL HIGH (ref 7.35–7.45)
pO2, Arterial: 117 mmHg — ABNORMAL HIGH (ref 83–108)

## 2022-01-27 LAB — SARS CORONAVIRUS 2 (TAT 6-24 HRS): SARS Coronavirus 2: NEGATIVE

## 2022-01-27 LAB — SURGICAL PCR SCREEN
MRSA, PCR: NEGATIVE
Staphylococcus aureus: NEGATIVE

## 2022-01-27 LAB — APTT: aPTT: 26 seconds (ref 24–36)

## 2022-01-27 LAB — PROTIME-INR
INR: 0.9 (ref 0.8–1.2)
Prothrombin Time: 12.4 seconds (ref 11.4–15.2)

## 2022-01-27 NOTE — Progress Notes (Addendum)
PCP - Dion Body with [facility] in [place] ?Cardiologist - Denies ? ?Chest x-ray - 01/27/22 ?EKG - 01/27/22 ?Stress Test - "been a long time"  no f/u needed ?ECHO - 05/27/21 ?Cardiac Cath - Denies ? ?Sleep Study - Denies ? ?DM - Denies ? ?Aspirin Instructions: Asked patient to call surgeon for instruction on when to stop, she stated to me she just wouldn't take it tomorrow. ? ?COVID TEST- 01/27/22 ? ?Anesthesia review: No ? ?Patient denies shortness of breath, fever, cough and chest pain at PAT appointment ? ? ?All instructions explained to the patient, with a verbal understanding of the material. Patient agrees to go over the instructions while at home for a better understanding. Patient also instructed to wear a mask while in public after being tested for COVID-19. The opportunity to ask questions was provided. ? ? ?

## 2022-01-28 ENCOUNTER — Inpatient Hospital Stay (HOSPITAL_COMMUNITY)
Admission: RE | Admit: 2022-01-28 | Discharge: 2022-01-30 | DRG: 328 | Disposition: A | Discharge status: Home / Self Care | Payer: Medicare Other | Attending: Thoracic Surgery (Cardiothoracic Vascular Surgery) | Admitting: Thoracic Surgery (Cardiothoracic Vascular Surgery)

## 2022-01-28 ENCOUNTER — Inpatient Hospital Stay (HOSPITAL_COMMUNITY): Payer: Medicare Other | Admitting: Critical Care Medicine

## 2022-01-28 ENCOUNTER — Encounter (HOSPITAL_COMMUNITY)
Admission: RE | Disposition: A | Discharge status: Home / Self Care | Payer: Self-pay | Source: Home / Self Care | Attending: Thoracic Surgery (Cardiothoracic Vascular Surgery)

## 2022-01-28 ENCOUNTER — Encounter (HOSPITAL_COMMUNITY): Payer: Self-pay | Admitting: Thoracic Surgery (Cardiothoracic Vascular Surgery)

## 2022-01-28 DIAGNOSIS — Z888 Allergy status to other drugs, medicaments and biological substances status: Secondary | ICD-10-CM | POA: Diagnosis not present

## 2022-01-28 DIAGNOSIS — K449 Diaphragmatic hernia without obstruction or gangrene: Secondary | ICD-10-CM

## 2022-01-28 DIAGNOSIS — K219 Gastro-esophageal reflux disease without esophagitis: Secondary | ICD-10-CM | POA: Diagnosis present

## 2022-01-28 DIAGNOSIS — Z885 Allergy status to narcotic agent status: Secondary | ICD-10-CM

## 2022-01-28 DIAGNOSIS — F429 Obsessive-compulsive disorder, unspecified: Secondary | ICD-10-CM | POA: Diagnosis not present

## 2022-01-28 DIAGNOSIS — Z8049 Family history of malignant neoplasm of other genital organs: Secondary | ICD-10-CM | POA: Diagnosis not present

## 2022-01-28 DIAGNOSIS — E876 Hypokalemia: Secondary | ICD-10-CM | POA: Diagnosis present

## 2022-01-28 DIAGNOSIS — Z801 Family history of malignant neoplasm of trachea, bronchus and lung: Secondary | ICD-10-CM | POA: Diagnosis not present

## 2022-01-28 DIAGNOSIS — J449 Chronic obstructive pulmonary disease, unspecified: Secondary | ICD-10-CM | POA: Diagnosis not present

## 2022-01-28 DIAGNOSIS — Z800 Family history of malignant neoplasm of digestive organs: Secondary | ICD-10-CM | POA: Diagnosis not present

## 2022-01-28 DIAGNOSIS — Z79899 Other long term (current) drug therapy: Secondary | ICD-10-CM | POA: Diagnosis not present

## 2022-01-28 DIAGNOSIS — Z20822 Contact with and (suspected) exposure to covid-19: Secondary | ICD-10-CM | POA: Diagnosis present

## 2022-01-28 DIAGNOSIS — Z85118 Personal history of other malignant neoplasm of bronchus and lung: Secondary | ICD-10-CM | POA: Diagnosis not present

## 2022-01-28 DIAGNOSIS — F419 Anxiety disorder, unspecified: Secondary | ICD-10-CM | POA: Diagnosis not present

## 2022-01-28 DIAGNOSIS — Z7982 Long term (current) use of aspirin: Secondary | ICD-10-CM | POA: Diagnosis not present

## 2022-01-28 DIAGNOSIS — Z823 Family history of stroke: Secondary | ICD-10-CM | POA: Diagnosis not present

## 2022-01-28 DIAGNOSIS — R1310 Dysphagia, unspecified: Secondary | ICD-10-CM | POA: Diagnosis present

## 2022-01-28 DIAGNOSIS — Z8249 Family history of ischemic heart disease and other diseases of the circulatory system: Secondary | ICD-10-CM

## 2022-01-28 DIAGNOSIS — Z902 Acquired absence of lung [part of]: Secondary | ICD-10-CM

## 2022-01-28 DIAGNOSIS — Z803 Family history of malignant neoplasm of breast: Secondary | ICD-10-CM

## 2022-01-28 DIAGNOSIS — Z8719 Personal history of other diseases of the digestive system: Principal | ICD-10-CM

## 2022-01-28 HISTORY — PX: ESOPHAGOGASTRODUODENOSCOPY: SHX5428

## 2022-01-28 HISTORY — PX: XI ROBOTIC ASSISTED PARAESOPHAGEAL HERNIA REPAIR: SHX6871

## 2022-01-28 LAB — GLUCOSE, CAPILLARY: Glucose-Capillary: 126 mg/dL — ABNORMAL HIGH (ref 70–99)

## 2022-01-28 SURGERY — REPAIR, HERNIA, PARAESOPHAGEAL, ROBOT-ASSISTED
Anesthesia: General | Site: Esophagus

## 2022-01-28 MED ORDER — PROPOFOL 10 MG/ML IV BOLUS
INTRAVENOUS | Status: AC
  Filled 2022-01-28: qty 20

## 2022-01-28 MED ORDER — FENTANYL CITRATE (PF) 250 MCG/5ML IJ SOLN
INTRAMUSCULAR | Status: AC
  Filled 2022-01-28: qty 5

## 2022-01-28 MED ORDER — PROPOFOL 10 MG/ML IV BOLUS
INTRAVENOUS | Status: DC | PRN
  Administered 2022-01-28: 50 mg via INTRAVENOUS
  Administered 2022-01-28: 30 mg via INTRAVENOUS
  Administered 2022-01-28: 50 mg via INTRAVENOUS
  Administered 2022-01-28 (×2): 30 mg via INTRAVENOUS

## 2022-01-28 MED ORDER — DROPERIDOL 2.5 MG/ML IJ SOLN
INTRAMUSCULAR | Status: AC
  Filled 2022-01-28: qty 2

## 2022-01-28 MED ORDER — FENTANYL CITRATE (PF) 250 MCG/5ML IJ SOLN
INTRAMUSCULAR | Status: DC | PRN
  Administered 2022-01-28: 50 ug via INTRAVENOUS
  Administered 2022-01-28: 100 ug via INTRAVENOUS
  Administered 2022-01-28 (×2): 50 ug via INTRAVENOUS

## 2022-01-28 MED ORDER — ROCURONIUM BROMIDE 10 MG/ML (PF) SYRINGE
PREFILLED_SYRINGE | INTRAVENOUS | Status: DC | PRN
  Administered 2022-01-28: 30 mg via INTRAVENOUS
  Administered 2022-01-28: 70 mg via INTRAVENOUS
  Administered 2022-01-28: 20 mg via INTRAVENOUS
  Administered 2022-01-28 (×2): 30 mg via INTRAVENOUS

## 2022-01-28 MED ORDER — ALBUMIN HUMAN 5 % IV SOLN: INTRAVENOUS | Status: DC | PRN

## 2022-01-28 MED ORDER — DROPERIDOL 2.5 MG/ML IJ SOLN
0.6250 mg | Freq: Once | INTRAMUSCULAR | Status: AC | PRN
  Administered 2022-01-28: 0.625 mg via INTRAVENOUS

## 2022-01-28 MED ORDER — ACETAMINOPHEN 650 MG RE SUPP
325.0000 mg | RECTAL | Status: DC | PRN
  Filled 2022-01-28: qty 1

## 2022-01-28 MED ORDER — METOPROLOL TARTRATE 5 MG/5ML IV SOLN: 5.0000 mg | Freq: Four times a day (QID) | INTRAVENOUS | Status: DC | PRN

## 2022-01-28 MED ORDER — ONDANSETRON HCL 4 MG/2ML IJ SOLN
INTRAMUSCULAR | Status: DC | PRN
Start: 2022-01-28 — End: 2022-01-28
  Administered 2022-01-28: 4 mg via INTRAVENOUS

## 2022-01-28 MED ORDER — CEFAZOLIN SODIUM-DEXTROSE 2-4 GM/100ML-% IV SOLN
2.0000 g | INTRAVENOUS | Status: AC
  Administered 2022-01-28: 2 g via INTRAVENOUS
  Filled 2022-01-28: qty 100

## 2022-01-28 MED ORDER — LACTATED RINGERS IV SOLN
INTRAVENOUS | Status: DC | PRN
Start: 2022-01-28 — End: 2022-01-28

## 2022-01-28 MED ORDER — VASOPRESSIN 20 UNIT/ML IV SOLN
INTRAVENOUS | Status: AC
  Filled 2022-01-28: qty 1

## 2022-01-28 MED ORDER — BUPIVACAINE HCL (PF) 0.5 % IJ SOLN
INTRAMUSCULAR | Status: AC
  Filled 2022-01-28: qty 30

## 2022-01-28 MED ORDER — CEFAZOLIN SODIUM-DEXTROSE 2-4 GM/100ML-% IV SOLN
2.0000 g | Freq: Three times a day (TID) | INTRAVENOUS | Status: AC
  Administered 2022-01-28: 2 g via INTRAVENOUS
  Filled 2022-01-28 (×2): qty 100

## 2022-01-28 MED ORDER — HEMOSTATIC AGENTS (NO CHARGE) OPTIME
TOPICAL | Status: DC | PRN
  Administered 2022-01-28: 1 via TOPICAL

## 2022-01-28 MED ORDER — PHENYLEPHRINE 40 MCG/ML (10ML) SYRINGE FOR IV PUSH (FOR BLOOD PRESSURE SUPPORT)
PREFILLED_SYRINGE | INTRAVENOUS | Status: DC | PRN
Start: 2022-01-28 — End: 2022-01-28
  Administered 2022-01-28 (×2): 40 ug via INTRAVENOUS

## 2022-01-28 MED ORDER — FENTANYL CITRATE (PF) 100 MCG/2ML IJ SOLN: 25.0000 ug | INTRAMUSCULAR | Status: DC | PRN

## 2022-01-28 MED ORDER — BUPIVACAINE LIPOSOME 1.3 % IJ SUSP
INTRAMUSCULAR | Status: DC | PRN
Start: 2022-01-28 — End: 2022-01-28
  Administered 2022-01-28: 20 mL

## 2022-01-28 MED ORDER — ENOXAPARIN SODIUM 40 MG/0.4ML IJ SOSY
40.0000 mg | PREFILLED_SYRINGE | INTRAMUSCULAR | Status: DC
  Administered 2022-01-29 – 2022-01-30 (×2): 40 mg via SUBCUTANEOUS
  Filled 2022-01-28 (×2): qty 0.4

## 2022-01-28 MED ORDER — LACTATED RINGERS IV SOLN: INTRAVENOUS | Status: DC

## 2022-01-28 MED ORDER — ACETAMINOPHEN 500 MG PO TABS: 1000.0000 mg | ORAL_TABLET | Freq: Once | ORAL | Status: DC

## 2022-01-28 MED ORDER — CHLORHEXIDINE GLUCONATE 0.12 % MT SOLN
15.0000 mL | Freq: Once | OROMUCOSAL | Status: AC
  Administered 2022-01-28: 15 mL via OROMUCOSAL
  Filled 2022-01-28: qty 15

## 2022-01-28 MED ORDER — ONDANSETRON HCL 4 MG/2ML IJ SOLN
4.0000 mg | Freq: Four times a day (QID) | INTRAMUSCULAR | Status: DC | PRN
  Administered 2022-01-28: 4 mg via INTRAVENOUS

## 2022-01-28 MED ORDER — BUPIVACAINE HCL 0.5 % IJ SOLN
INTRAMUSCULAR | Status: DC | PRN
Start: 2022-01-28 — End: 2022-01-28
  Administered 2022-01-28: 30 mL

## 2022-01-28 MED ORDER — LABETALOL HCL 5 MG/ML IV SOLN
INTRAVENOUS | Status: DC | PRN
  Administered 2022-01-28 (×4): 2.5 mg via INTRAVENOUS

## 2022-01-28 MED ORDER — KETOROLAC TROMETHAMINE 15 MG/ML IJ SOLN
15.0000 mg | Freq: Four times a day (QID) | INTRAMUSCULAR | Status: DC
  Administered 2022-01-28 – 2022-01-30 (×7): 15 mg via INTRAVENOUS
  Filled 2022-01-28 (×7): qty 1

## 2022-01-28 MED ORDER — SUGAMMADEX SODIUM 200 MG/2ML IV SOLN
INTRAVENOUS | Status: DC | PRN
  Administered 2022-01-28: 200 mg via INTRAVENOUS

## 2022-01-28 MED ORDER — MORPHINE SULFATE (PF) 2 MG/ML IV SOLN: 2.0000 mg | INTRAVENOUS | Status: DC | PRN

## 2022-01-28 MED ORDER — ONDANSETRON HCL 4 MG/2ML IJ SOLN
4.0000 mg | Freq: Four times a day (QID) | INTRAMUSCULAR | Status: DC
  Administered 2022-01-28 – 2022-01-30 (×6): 4 mg via INTRAVENOUS
  Filled 2022-01-28 (×7): qty 2

## 2022-01-28 MED ORDER — EPHEDRINE SULFATE-NACL 50-0.9 MG/10ML-% IV SOSY
PREFILLED_SYRINGE | INTRAVENOUS | Status: DC | PRN
  Administered 2022-01-28 (×2): 5 mg via INTRAVENOUS
  Administered 2022-01-28: 15 mg via INTRAVENOUS
  Administered 2022-01-28: 10 mg via INTRAVENOUS

## 2022-01-28 MED ORDER — ORAL CARE MOUTH RINSE: 15.0000 mL | Freq: Once | OROMUCOSAL | Status: AC

## 2022-01-28 MED ORDER — PHENYLEPHRINE HCL-NACL 20-0.9 MG/250ML-% IV SOLN
INTRAVENOUS | Status: DC | PRN
Start: 2022-01-28 — End: 2022-01-28
  Administered 2022-01-28: 20 ug/min via INTRAVENOUS

## 2022-01-28 MED ORDER — BUPIVACAINE LIPOSOME 1.3 % IJ SUSP
INTRAMUSCULAR | Status: AC
  Filled 2022-01-28: qty 20

## 2022-01-28 SURGICAL SUPPLY — 98 items
BLADE CLIPPER SURG (BLADE) ×3 IMPLANT
BLADE SURG 11 STRL SS (BLADE) ×3 IMPLANT
BUTTON OLYMPUS DEFENDO 5 PIECE (MISCELLANEOUS) ×3 IMPLANT
CANISTER SUCT 3000ML PPV (MISCELLANEOUS) ×6 IMPLANT
CANNULA REDUC XI 12-8 STAPL (CANNULA)
CANNULA REDUCER 12-8 DVNC XI (CANNULA) IMPLANT
CATH THORACIC 28FR (CATHETERS) IMPLANT
CNTNR URN SCR LID CUP LEK RST (MISCELLANEOUS) ×2 IMPLANT
CONT SPEC 4OZ STRL OR WHT (MISCELLANEOUS) ×2
COVER BACK TABLE 60X90IN (DRAPES) IMPLANT
DECANTER SPIKE VIAL GLASS SM (MISCELLANEOUS) ×3 IMPLANT
DEFOGGER SCOPE WARMER CLEARIFY (MISCELLANEOUS) ×3 IMPLANT
DERMABOND ADHESIVE PROPEN (GAUZE/BANDAGES/DRESSINGS) ×1
DERMABOND ADVANCED (GAUZE/BANDAGES/DRESSINGS) ×1
DERMABOND ADVANCED .7 DNX12 (GAUZE/BANDAGES/DRESSINGS) ×4 IMPLANT
DERMABOND ADVANCED .7 DNX6 (GAUZE/BANDAGES/DRESSINGS) IMPLANT
DEVICE SUTURE ENDOST 10MM (ENDOMECHANICALS) IMPLANT
DRAIN PENROSE 1/2X12 LTX STRL (WOUND CARE) ×3 IMPLANT
DRAPE ARM DVNC X/XI (DISPOSABLE) ×8 IMPLANT
DRAPE COLUMN DVNC XI (DISPOSABLE) ×2 IMPLANT
DRAPE CV SPLIT W-CLR ANES SCRN (DRAPES) ×3 IMPLANT
DRAPE DA VINCI XI ARM (DISPOSABLE) ×4
DRAPE DA VINCI XI COLUMN (DISPOSABLE) ×1
DRAPE INCISE IOBAN 66X45 STRL (DRAPES) IMPLANT
DRAPE ORTHO SPLIT 77X108 STRL (DRAPES) ×1
DRAPE SURG ORHT 6 SPLT 77X108 (DRAPES) ×2 IMPLANT
ELECT REM PT RETURN 9FT ADLT (ELECTROSURGICAL) ×3
ELECTRODE REM PT RTRN 9FT ADLT (ELECTROSURGICAL) ×2 IMPLANT
FELT TEFLON 1X6 (MISCELLANEOUS) IMPLANT
FORCEPS GRASP COMBO 8X230 (FORCEP) ×3 IMPLANT
GAUZE 4X4 16PLY ~~LOC~~+RFID DBL (SPONGE) ×3 IMPLANT
GAUZE SPONGE 4X4 12PLY STRL (GAUZE/BANDAGES/DRESSINGS) ×3 IMPLANT
GLOVE SURG ENC MOIS LTX SZ7.5 (GLOVE) ×6 IMPLANT
GOWN STRL REUS W/ TWL LRG LVL3 (GOWN DISPOSABLE) ×2 IMPLANT
GOWN STRL REUS W/ TWL XL LVL3 (GOWN DISPOSABLE) ×4 IMPLANT
GOWN STRL REUS W/TWL 2XL LVL3 (GOWN DISPOSABLE) ×3 IMPLANT
GOWN STRL REUS W/TWL LRG LVL3 (GOWN DISPOSABLE) ×1
GOWN STRL REUS W/TWL XL LVL3 (GOWN DISPOSABLE) ×2
GRAFT MYRIAD 5 LAYER 7X10 (Graft) ×1 IMPLANT
GRASPER SUT TROCAR 14GX15 (MISCELLANEOUS) IMPLANT
HEMOSTAT SURGICEL 2X14 (HEMOSTASIS) IMPLANT
IRRIGATOR SUCT 8 DISP DVNC XI (IRRIGATION / IRRIGATOR) IMPLANT
IRRIGATOR SUCTION 8MM XI DISP (IRRIGATION / IRRIGATOR) ×1
IV NS 1000ML (IV SOLUTION)
IV NS 1000ML BAXH (IV SOLUTION) IMPLANT
KIT BASIN OR (CUSTOM PROCEDURE TRAY) ×3 IMPLANT
KIT TURNOVER KIT B (KITS) ×3 IMPLANT
LIGASURE LAP MARYLAND 5MM 37CM (ELECTROSURGICAL) ×1 IMPLANT
MARKER SKIN DUAL TIP RULER LAB (MISCELLANEOUS) ×3 IMPLANT
NDL 18GX1X1/2 (RX/OR ONLY) (NEEDLE) IMPLANT
NDL SCLEROTHERAPY 23X2X240 (NEEDLE) IMPLANT
NEEDLE 18GX1X1/2 (RX/OR ONLY) (NEEDLE) IMPLANT
NEEDLE HYPO 22GX1.5 SAFETY (NEEDLE) ×3 IMPLANT
NEEDLE SCLEROTHERAPY 23X2X240 (NEEDLE) IMPLANT
NS IRRIG 1000ML POUR BTL (IV SOLUTION) ×6 IMPLANT
OBTURATOR OPTICAL STANDARD 8MM (TROCAR) ×1
OBTURATOR OPTICAL STND 8 DVNC (TROCAR) ×2
OBTURATOR OPTICALSTD 8 DVNC (TROCAR) IMPLANT
OIL SILICONE PENTAX (PARTS (SERVICE/REPAIRS)) IMPLANT
PACK CHEST (CUSTOM PROCEDURE TRAY) ×3 IMPLANT
PACK UNIVERSAL I (CUSTOM PROCEDURE TRAY) IMPLANT
PAD ARMBOARD 7.5X6 YLW CONV (MISCELLANEOUS) ×6 IMPLANT
PORT ACCESS TROCAR AIRSEAL 12 (TROCAR) ×2 IMPLANT
PORT ACCESS TROCAR AIRSEAL 5M (TROCAR) ×1
SEAL CANN UNIV 5-8 DVNC XI (MISCELLANEOUS) ×8 IMPLANT
SEAL XI 5MM-8MM UNIVERSAL (MISCELLANEOUS) ×4
SEALER SYNCHRO 8 IS4000 DV (MISCELLANEOUS) ×1
SEALER SYNCHRO 8 IS4000 DVNC (MISCELLANEOUS) IMPLANT
SET TRI-LUMEN FLTR TB AIRSEAL (TUBING) ×3 IMPLANT
SHEET MEDIUM DRAPE 40X70 STRL (DRAPES) ×3 IMPLANT
SPONGE T-LAP 18X18 ~~LOC~~+RFID (SPONGE) ×12 IMPLANT
STAPLER CANNULA SEAL DVNC XI (STAPLE) IMPLANT
STAPLER CANNULA SEAL XI (STAPLE)
SUT ETHIBOND 0 36 GRN (SUTURE) ×6 IMPLANT
SUT SILK  1 MH (SUTURE) ×1
SUT SILK 1 MH (SUTURE) ×2 IMPLANT
SUT SURGIDAC NAB ES-9 0 48 120 (SUTURE) IMPLANT
SUT V-LOC BARB 180 2/0GR6 GS22 (SUTURE) ×6
SUT VIC AB 3-0 SH 27 (SUTURE) ×2
SUT VIC AB 3-0 SH 27X BRD (SUTURE) ×4 IMPLANT
SUT VIC AB 3-0 X1 27 (SUTURE) ×6 IMPLANT
SUT VICRYL 0 UR6 27IN ABS (SUTURE) ×6 IMPLANT
SUTURE V-LC BRB 180 2/0GR6GS22 (SUTURE) IMPLANT
SYR 10ML LL (SYRINGE) IMPLANT
SYR 20CC LL (SYRINGE) ×6 IMPLANT
SYR 20ML ECCENTRIC (SYRINGE) ×3 IMPLANT
SYR 30ML SLIP (SYRINGE) ×3 IMPLANT
SYSTEM SAHARA CHEST DRAIN ATS (WOUND CARE) IMPLANT
TOWEL GREEN STERILE (TOWEL DISPOSABLE) ×3 IMPLANT
TOWEL GREEN STERILE FF (TOWEL DISPOSABLE) ×3 IMPLANT
TRAY FOLEY MTR SLVR 16FR STAT (SET/KITS/TRAYS/PACK) ×3 IMPLANT
TROCAR PORT AIRSEAL 8X120 (TROCAR) ×3 IMPLANT
TROCAR XCEL BLADELESS 5X75MML (TROCAR) IMPLANT
TROCAR XCEL NON-BLD 5MMX100MML (ENDOMECHANICALS) IMPLANT
TUBE CONNECTING 20X1/4 (TUBING) ×3 IMPLANT
TUBING ENDO SMARTCAP (MISCELLANEOUS) ×3 IMPLANT
UNDERPAD 30X36 HEAVY ABSORB (UNDERPADS AND DIAPERS) ×3 IMPLANT
WATER STERILE IRR 1000ML POUR (IV SOLUTION) ×3 IMPLANT

## 2022-01-28 NOTE — Brief Op Note (Signed)
01/28/2022 ? ?2:20 PM ? ?PATIENT:  Gail Salinas  81 y.o. female ? ?PRE-OPERATIVE DIAGNOSIS:  PEH ? ?POST-OPERATIVE DIAGNOSIS:  PEH ? ?PROCEDURE:  Procedure(s): ?XI ROBOTIC ASSISTED PARAESOPHAGEAL HERNIA REPAIR WITH MYRIAD MESH WITH FUNDOPLICATION (N/A) ?ESOPHAGOGASTRODUODENOSCOPY (EGD) (N/A) ? ?SURGEON:  Surgeon(s) and Role: ?   * Lajuana Matte, MD - Primary ? ?PHYSICIAN ASSISTANT: Enid Cutter PA-C, Kanyah Matsushima PA-C ? ?ASSISTANTS: none  ? ?ANESTHESIA:   general ? ?EBL:  100 mL  ? ?BLOOD ADMINISTERED:none ? ?DRAINS: none  ? ?LOCAL MEDICATIONS USED:  BUPIVICAINE  ? ?SPECIMEN:  Source of Specimen:  Hiatal Hernia Sac ? ?DISPOSITION OF SPECIMEN:  PATHOLOGY ? ?COUNTS:  YES ? ?TOURNIQUET:  * No tourniquets in log * ? ?DICTATION: .Dragon Dictation ? ?PLAN OF CARE: Admit to inpatient  ? ?PATIENT DISPOSITION:  PACU - hemodynamically stable. ?  ?Delay start of Pharmacological VTE agent (>24hrs) due to surgical blood loss or risk of bleeding: no ? ?

## 2022-01-28 NOTE — Anesthesia Preprocedure Evaluation (Signed)
Anesthesia Evaluation  ?Patient identified by MRN, date of birth, ID band ?Patient awake ? ? ? ?Reviewed: ?Allergy & Precautions, H&P , NPO status , Patient's Chart, lab work & pertinent test results, Unable to perform ROS - Chart review only ? ?History of Anesthesia Complications ?(+) PONV and history of anesthetic complications ? ?Airway ?Mallampati: III ? ?TM Distance: >3 FB ?Neck ROM: Full ? ? ? Dental ? ?(+) Chipped, Poor Dentition, Caps ?  ?Pulmonary ?neg shortness of breath, COPD, neg recent URI,  ?  ?breath sounds clear to auscultation ? ? ? ? ? ? Cardiovascular ?Exercise Tolerance: Good ?(-) angina(-) Past MI and (-) DOE negative cardio ROS ? ? ?Rhythm:Regular  ? ?  ?Neuro/Psych ?PSYCHIATRIC DISORDERS Anxiety negative neurological ROS ?   ? GI/Hepatic ?Neg liver ROS, hiatal hernia, GERD  Medicated and Controlled,  ?Endo/Other  ?negative endocrine ROS ? Renal/GU ?negative Renal ROSLab Results ?     Component                Value               Date                 ?     CREATININE               0.80                09/16/2021           ?  ?negative genitourinary ?  ?Musculoskeletal ? ? Abdominal ?  ?Peds ? Hematology ?negative hematology ROS ?(+) Lab Results ?     Component                Value               Date                 ?     WBC                      8.9                 09/16/2021           ?     HGB                      13.4                09/16/2021           ?     HCT                      40.2                09/16/2021           ?     MCV                      87.4                09/16/2021           ?     PLT                      299                 09/16/2021           ?   ?  Anesthesia Other Findings ?Past Medical History: ?No date: Anxiety ?No date: COPD (chronic obstructive pulmonary disease) ([place]) ?No date: GERD (gastroesophageal reflux disease) ?No date: OCD (obsessive compulsive disorder) ? ?Past Surgical History: ?No date: ABDOMINAL HYSTERECTOMY ?2015: BREAST  BIOPSY; Left ?    Comment:  Negative ?No date: TONSILLECTOMY ? ?BMI   ? Body Mass Index:  33.46 kg/m?  ?  ? ? Reproductive/Obstetrics ?negative OB ROS ? ?  ? ? ? ? ? ? ? ? ? ? ? ? ? ?  ?  ? ? ? ? ? ? ? ? ?Anesthesia Physical ? ?Anesthesia Plan ? ?ASA: 3 ? ?Anesthesia Plan: General  ? ?Post-op Pain Management: Tylenol PO (pre-op)* and Celebrex PO (pre-op)*  ? ?Induction: Intravenous ? ?PONV Risk Score and Plan: 3 and Ondansetron, Dexamethasone, Treatment may vary due to age or medical condition, Propofol infusion and TIVA ? ?Airway Management Planned: Oral ETT and Double Lumen EBT ? ?Additional Equipment:  ? ?Intra-op Plan:  ? ?Post-operative Plan: Extubation in OR ? ?Informed Consent: I have reviewed the patients History and Physical, chart, labs and discussed the procedure including the risks, benefits and alternatives for the proposed anesthesia with the patient or authorized representative who has indicated his/her understanding and acceptance.  ? ? ? ?Dental advisory given ? ?Plan Discussed with: CRNA ? ?Anesthesia Plan Comments: ([place])  ? ? ? ? ? ? ?Anesthesia Quick Evaluation ? ?

## 2022-01-28 NOTE — Interval H&P Note (Signed)
History and Physical Interval Note: ? ?01/28/2022 ?8:10 AM ? ?TANYIA GRABBE  has presented today for surgery, with the diagnosis of PEH.  The various methods of treatment have been discussed with the patient and family. After consideration of risks, benefits and other options for treatment, the patient has consented to  Procedure(s): ?XI ROBOTIC ASSISTED PARAESOPHAGEAL HERNIA REPAIR WITH FUNDOPLICATION (N/A) ?ESOPHAGOGASTRODUODENOSCOPY (EGD) (N/A) as a surgical intervention.  The patient's history has been reviewed, patient examined, no change in status, stable for surgery.  I have reviewed the patient's chart and labs.  Questions were answered to the patient's satisfaction.   ? ? ?Lucile Crater Prophet [place] ? ? ?

## 2022-01-28 NOTE — Anesthesia Procedure Notes (Addendum)
Arterial Line Insertion ?Start/End3/29/2023 11:35 AM, 01/28/2022 11:55 AM ?Performed by: Nolon Nations, MD ? Patient location: Pre-op. ?Preanesthetic checklist: patient identified, IV checked, site marked, risks and benefits discussed, surgical consent, monitors and equipment checked, pre-op evaluation, timeout performed and anesthesia consent ?Lidocaine 1% used for infiltration ?Left, radial was placed ?Catheter size: 20 G ?Hand hygiene performed  and maximum sterile barriers used  ? ?Attempts: 2 ?Procedure performed using ultrasound guided technique. ?Ultrasound Notes:anatomy identified, needle tip was noted to be adjacent to the nerve/plexus identified, no ultrasound evidence of intravascular and/or intraneural injection and image(s) printed for medical record ?Following insertion, dressing applied and Biopatch. ?Post procedure assessment: normal and unchanged ? ?Post procedure complications: second provider assisted. ?Patient tolerated the procedure well with no immediate complications. ? ? ? ?

## 2022-01-28 NOTE — Op Note (Signed)
? ?   ?[address] ?      [place] [address] ?            [phone]      ? ? ?01/28/2022 ? ?Patient:  Gail Salinas ?Pre-Op Dx: Paraesophageal hernia  ?GERD ?Odynophagia   ?Post-op Dx:  same ?Procedure: ?- Esophagoscopy ?- Robotic assisted laparoscopy ?- Paraesophageal hernia repair with Myriad Mesh pledgets ?- Toupet Fundoplication ? ? ?Surgeon and Role:   ?   * Lajuana Matte, MD - Primary ? ?Assistant: Jerilynn Mages. Johnnye Sima, PA-C  ?An experienced assistant was required given the complexity of this surgery and the standard of surgical care. The assistant was needed for exposure, dissection, suctioning, retraction of delicate tissues and sutures, instrument exchange and for overall help during this procedure.   ?Anesthesia  general ?EBL:  69m ?Blood Administration: none ?Specimen:  hernia sac ? ? ?Counts: correct ? ? ?Indications: ?81year old female with history of left lower lobe non-small cell lung cancer status post lobectomy in November 2022 who also has a large paraesophageal hernia.  She is symptomatic from this and is agreeable to proceed with an upper endoscopy and robotic assisted paraesophageal hernia repair with fundoplication.  Given her lower lobectomy she likely will have some scar tissue to the left pleural space this care will need to be taken while mobilizing the hernia sac.  ? ?Findings: ?Patulous esophagus.  Tortuous stomach.  Unable to intubate the fundus prior to reduction.  Large hiatal defect.  6 stitches required for repair ? ?Operative Technique: ?After the risks, benefits and alternatives were thoroughly discussed, the patient was brought to the operative theatre.  Anesthesia was induced, and the esophagoscope was passed through the oropharynx down to the stomach.  The scope was retroflexed and the hiatal hernia was clearly evident.  The scope was pulled back and the mucosal surface of the esophagus was visualized.    The scope was then parked at 25 cm from the  incisors.  The patient was then prepped and draped in normal sterile fashion.  An appropriate surgical pause was performed, and pre-operative antibiotics were dosed accordingly. ? ?We began with a 1 cm incision 15 cm caudad from the xiphoid and slightly lateral to the umbilicus.  Using an Optiview we entered the peritoneal space.  The abdomen was then insufflated with CO2.  3 other robotic ports were placed to triangulate the hiatus.  Another 12 mm port was placed in place at the level of the umbilicus laterally for an assistant port and another 5 mm trocar was placed in the right lower quadrant for liver retractor.  The patient was then placed in steep reverse Trendelenburg and the liver was elevated to expose the esophageal hiatus.  And then the robot was docked. ? ?We began by dividing the gastrohepatic ligament to expose the right diaphragmatic crus and then dissected the hernia sac in a clockwise fashion to mobilize there the stomach and esophagus.  We then divided the short gastrics and moved towards the right crus and completed our dissection along the esophageal hiatus.  A Penrose drain was then used to encircle the the esophagus and we continued our dissection up into the mediastinum.  Once we had achieved 3 to 4 cm of intra-abdominal esophagus we then proceeded to reapproximate the crura with 0 Ethibond sutures in an interrupted fashion.  Biologic mesh pledgets were used to re-inforce the repair.  The gastroscope was passed down through the lower esophageal sphincter into the  stomach and would act as our bougie during this repair.  Next the stomach was passed posterior to the esophagus and Toupet fundoplication was performed.  An air leak test was performed using the gastroscope.  No leak was evident.  The liver retractor was removed and all ports were removed under direct visualization.  The skin and soft tissue were closed with absorbable suture   ? ?The patient tolerated the procedure without any  immediate complications, and was transferred to the PACU in stable condition. ? ?Lajuana Matte ? ?

## 2022-01-28 NOTE — Progress Notes (Signed)
Admission from PACU  by bed, sleepy , arouse easily. ?

## 2022-01-28 NOTE — Anesthesia Procedure Notes (Signed)
Procedure Name: Intubation ?Date/Time: 01/28/2022 10:51 AM ?Performed by: Griffin Dakin, CRNA ?Pre-anesthesia Checklist: Patient identified, Emergency Drugs available, Suction available and Patient being monitored ?Patient Re-evaluated:Patient Re-evaluated prior to induction ?Oxygen Delivery Method: Circle system utilized ?Preoxygenation: Pre-oxygenation with 100% oxygen ?Induction Type: IV induction ?Ventilation: Mask ventilation without difficulty and Oral airway inserted - appropriate to patient size ?Laryngoscope Size: Mac and 4 ?Grade View: Grade II ?Tube type: Oral ?Tube size: 7.0 mm ?Number of attempts: 1 ?Airway Equipment and Method: Stylet and Oral airway ?Placement Confirmation: ETT inserted through vocal cords under direct vision, positive ETCO2 and breath sounds checked- equal and bilateral ?Secured at: 25 cm ?Tube secured with: Tape ?Dental Injury: Teeth and Oropharynx as per pre-operative assessment  ? ? ? ? ?

## 2022-01-28 NOTE — Transfer of Care (Signed)
Immediate Anesthesia Transfer of Care Note ? ?Patient: Gail Salinas ? ?Procedure(s) Performed: XI ROBOTIC ASSISTED PARAESOPHAGEAL HERNIA REPAIR WITH MYRIAD MESH WITH FUNDOPLICATION (Chest) ?ESOPHAGOGASTRODUODENOSCOPY (EGD) (Esophagus) ? ?Patient Location: PACU ? ?Anesthesia Type:General ? ?Level of Consciousness: awake, alert  and oriented ? ?Airway & Oxygen Therapy: Patient Spontanous Breathing and Patient connected to face mask oxygen ? ?Post-op Assessment: Report given to RN and Post -op Vital signs reviewed and stable ? ?Post vital signs: Reviewed and stable ? ?Last Vitals:  ?Vitals Value Taken Time  ?BP 160/94 01/28/22 1445  ?Temp    ?Pulse 76 01/28/22 1448  ?Resp 16 01/28/22 1448  ?SpO2 100 % 01/28/22 1448  ?Vitals shown include unvalidated device data. ? ?Last Pain:  ?Vitals:  ? 01/28/22 0819  ?TempSrc:   ?PainSc: 0-No pain  ?   ? ?  ? ?Complications: No notable events documented. ?

## 2022-01-29 ENCOUNTER — Inpatient Hospital Stay (HOSPITAL_COMMUNITY): Payer: Medicare Other

## 2022-01-29 ENCOUNTER — Encounter (HOSPITAL_COMMUNITY): Payer: Self-pay | Admitting: Thoracic Surgery (Cardiothoracic Vascular Surgery)

## 2022-01-29 LAB — CBC
HCT: 34.6 % — ABNORMAL LOW (ref 36.0–46.0)
Hemoglobin: 11.4 g/dL — ABNORMAL LOW (ref 12.0–15.0)
MCH: 27.8 pg (ref 26.0–34.0)
MCHC: 32.9 g/dL (ref 30.0–36.0)
MCV: 84.4 fL (ref 80.0–100.0)
Platelets: 241 10*3/uL (ref 150–400)
RBC: 4.1 MIL/uL (ref 3.87–5.11)
RDW: 13.7 % (ref 11.5–15.5)
WBC: 12.4 10*3/uL — ABNORMAL HIGH (ref 4.0–10.5)
nRBC: 0 % (ref 0.0–0.2)

## 2022-01-29 LAB — BASIC METABOLIC PANEL
Anion gap: 14 (ref 5–15)
BUN: 14 mg/dL (ref 8–23)
CO2: 26 mmol/L (ref 22–32)
Calcium: 8.1 mg/dL — ABNORMAL LOW (ref 8.9–10.3)
Chloride: 96 mmol/L — ABNORMAL LOW (ref 98–111)
Creatinine, Ser: 0.8 mg/dL (ref 0.44–1.00)
GFR, Estimated: >60 mL/min (ref >60)
Glucose, Bld: 117 mg/dL — ABNORMAL HIGH (ref 70–99)
Potassium: 2.7 mmol/L — CL (ref 3.5–5.1)
Sodium: 136 mmol/L (ref 135–145)

## 2022-01-29 MED ORDER — DULOXETINE HCL 30 MG PO CPEP
30.0000 mg | ORAL_CAPSULE | Freq: Every day | ORAL | Status: DC
  Administered 2022-01-29 – 2022-01-30 (×2): 30 mg via ORAL
  Filled 2022-01-29 (×2): qty 1

## 2022-01-29 MED ORDER — MONTELUKAST SODIUM 10 MG PO TABS
10.0000 mg | ORAL_TABLET | Freq: Every day | ORAL | Status: DC
  Filled 2022-01-29: qty 1

## 2022-01-29 MED ORDER — POTASSIUM CHLORIDE CRYS ER 20 MEQ PO TBCR
20.0000 meq | EXTENDED_RELEASE_TABLET | Freq: Four times a day (QID) | ORAL | Status: DC
  Administered 2022-01-29 (×3): 20 meq via ORAL
  Filled 2022-01-29 (×3): qty 1

## 2022-01-29 MED ORDER — PANTOPRAZOLE SODIUM 40 MG PO TBEC
40.0000 mg | DELAYED_RELEASE_TABLET | Freq: Every day | ORAL | Status: DC
  Filled 2022-01-29: qty 1

## 2022-01-29 MED ORDER — LOPERAMIDE HCL 1 MG/7.5ML PO SUSP
1.0000 mg | ORAL | Status: DC | PRN
  Administered 2022-01-29: 1 mg via ORAL
  Filled 2022-01-29 (×2): qty 7.5

## 2022-01-29 MED ORDER — ASPIRIN EC 81 MG PO TBEC
81.0000 mg | DELAYED_RELEASE_TABLET | Freq: Every day | ORAL | Status: DC
Start: 2022-01-30 — End: 2022-01-30
  Administered 2022-01-30: 81 mg via ORAL
  Filled 2022-01-29: qty 1

## 2022-01-29 MED ORDER — OXYCODONE HCL 5 MG/5ML PO SOLN: 5.0000 mg | ORAL | Status: DC | PRN

## 2022-01-29 MED ORDER — POTASSIUM CHLORIDE 10 MEQ/100ML IV SOLN
10.0000 meq | INTRAVENOUS | Status: AC
  Administered 2022-01-29 (×3): 10 meq via INTRAVENOUS
  Filled 2022-01-29 (×3): qty 100

## 2022-01-29 MED ORDER — IOHEXOL 300 MG/ML  SOLN
100.0000 mL | Freq: Once | INTRAMUSCULAR | Status: AC | PRN
  Administered 2022-01-29: 100 mL via ORAL

## 2022-01-29 NOTE — Progress Notes (Addendum)
? ?   ?  [address] ?      [place] [address] ?            [phone]   ? ?  ?1 Day Post-Op Procedure(s) (LRB): ?XI ROBOTIC ASSISTED PARAESOPHAGEAL HERNIA REPAIR WITH MYRIAD MESH WITH FUNDOPLICATION (N/A) ?ESOPHAGOGASTRODUODENOSCOPY (EGD) (N/A) ? ?Subjective: ? ?Patient doesn't feel great this morning.  She states this is much worse than when she had her lung removed.  She states her potassium got low overnight which is chronic for her, but IV replacement really burns and was painful ? ?Objective: ?Vital signs in last 24 hours: ?Temp:  [97.7 ?F (36.5 ?C)-98.7 ?F (37.1 ?C)] 98.7 ?F (37.1 ?C) (03/30 0300) ?Pulse Rate:  [63-85] 76 (03/30 0300) ?Cardiac Rhythm: Heart block (03/30 0715) ?Resp:  [14-20] 20 (03/30 0300) ?BP: (114-172)/(52-94) 114/53 (03/30 0300) ?SpO2:  [91 %-98 %] 92 % (03/30 0300) ?Arterial Line BP: (140-173)/(57-79) 143/57 (03/29 1600) ?FiO2 (%):  [21 %] 21 % (03/29 1442) ?Weight:  [81.6 kg] 81.6 kg (03/29 0809) ? ?Intake/Output from previous day: ?03/29 0701 - 03/30 0700 ?In: 2500 [I.V.:2150; IV Piggyback:350] ?Out: 4967 [Urine:1450; Blood:100] ? ?General appearance: alert, cooperative, and no distress ?Heart: regular rate and rhythm ?Lungs: clear to auscultation bilaterally ?Abdomen: soft, non-tender; bowel sounds normal; no masses,  no organomegaly ?Extremities: extremities normal, atraumatic, no cyanosis or edema ?Wound: clean and dry ? ?Lab Results: ?Recent Labs  ?  01/27/22 ?1317 01/29/22 ?0146  ?WBC 8.8 12.4*  ?HGB 13.5 11.4*  ?HCT 40.9 34.6*  ?PLT 331 241  ? ?BMET:  ?Recent Labs  ?  01/27/22 ?1317 01/29/22 ?0146  ?NA 137 136  ?K 4.2 2.7*  ?CL 104 96*  ?CO2 23 26  ?GLUCOSE 97 117*  ?BUN 21 14  ?CREATININE 0.83 0.80  ?CALCIUM 9.4 8.1*  ?  ?PT/INR:  ?Recent Labs  ?  01/27/22 ?1317  ?LABPROT 12.4  ?INR 0.9  ? ?ABG ?   ?Component Value Date/Time  ? PHART 7.48 (H) 01/27/2022 1348  ? HCO3 25.3 01/27/2022 1348  ? ACIDBASEDEF 0.8 09/16/2021 1347  ? O2SAT 98.7 01/27/2022 1348  ? ?CBG  (last 3)  ?Recent Labs  ?  01/28/22 ?1445  ?GLUCAP 126*  ? ? ?Assessment/Plan: ?S/P Procedure(s) (LRB): ?XI ROBOTIC ASSISTED PARAESOPHAGEAL HERNIA REPAIR WITH MYRIAD MESH WITH FUNDOPLICATION (N/A) ?ESOPHAGOGASTRODUODENOSCOPY (EGD) (N/A) ? ?CV- NSR, BP controlled ?2. Pulm- off oxygen, no acute issues ?3. Hypokalemia- 2.7, IV replacement ordered ?4. GI- remains NPO, for esophogram this morning, if no leak will start diet, denies nausea with zofran ?5. Dispo- patient stable, supplementing potassium, esophogram this morning, if no leak will start diet, patient feeling poorly overall, I suspect she will not be ready for d/c today. ? ? LOS: 1 day  ? ?Ellwood Handler, PA-C ?01/29/2022 ? ?Agree with above. ?Replacing potassium. ?Esophagram shows no leak. ?We will start on dysphagia 1 diet. ?Dispo planning for tomorrow. ? ?Lajuana Matte ? ?

## 2022-01-29 NOTE — Discharge Summary (Signed)
Physician Discharge Summary  ?Patient ID: ?Gail Salinas ?MRN: [id number] ?DOB/AGE: 1941/02/02 81 y.o. ? ?Admit date: 01/28/2022 ?Discharge date: 01/30/2022 ? ?Admission Diagnoses: ? ?Patient Active Problem List  ? Diagnosis Date Noted  ? Lung cancer ([place]) 11/24/2021  ? Left lower lobe pulmonary nodule 09/18/2021  ? Lung nodule 08/19/2021  ? Anxiety 07/09/2021  ? 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 GERD without esophagitis 07/09/2021  ? Hypomagnesemia 07/09/2021  ? Pulmonary lesion 06/17/2021  ? Obesity (BMI 30.0-34.9) 06/22/2018  ? Borderline diabetic 06/10/2016  ? Encounter for general adult medical examination without abnormal findings 06/10/2016  ? ?Discharge Diagnoses:  ? ?Patient Active Problem List  ? Diagnosis Date Noted  ? S/P repair of paraesophageal hernia 01/28/2022  ? Lung cancer ([place]) 11/24/2021  ? Left lower lobe pulmonary nodule 09/18/2021  ? Lung nodule 08/19/2021  ? Anxiety 07/09/2021  ? 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 GERD without esophagitis 07/09/2021  ? Hypomagnesemia 07/09/2021  ? Pulmonary lesion 06/17/2021  ? Obesity (BMI 30.0-34.9) 06/22/2018  ? Borderline diabetic 06/10/2016  ? Encounter for general adult medical examination without abnormal findings 06/10/2016  ? ?Discharged Condition: good ? ?History of Present Illness: ? ?Gail Salinas 81 y.o. female known to [facility].  She underwent a robotic assisted left lower lobectomy for non-small cell lung cancer in November 2022.  She had a known history of a hiatal hernia.  She has recovered well from her lung surgery.  She has been quite symptomatic from her hiatal hernia for several years as well.  She admits to dysphagia and severe reflux.  She also admits to some odynophagia with meals and with different positions.  She was again evaluated by Dr. Kipp Brood who recommended Robotic repair of her hernia.  The risks and benefits of the procedure were explained to the patient and she was agreeable to proceed. ? ?Hospital  Course: ? ?Gail Salinas presented to [facility] on 01/28/2022.  She underwent Robotic Assisted Laparoscopy with Paraesophageal hernia repair with Myriad Mesh pledgets, Toupet Fundoplication, and Esophagoscopy.  She tolerated the procedure, was extubated and taken to PACU in stable condition.  The patient remained NPO overnight.  She underwent an Esophogram the morning of POD #1 which showed no evidence of esophageal leak.  She was started on a liquid diet with final progression to a soft diet which would remain for several weeks.  She was hypokalemic and supplemented with IV replacement.  She was restarted on her home medications. The patient tolerated a diet without difficulty. She had an episode of mild diarrhea, but denied N/V.  She is ambulating independently.  She is medically stable for discharge home today. ? ?Consults: None ? ?Significant Diagnostic Studies: Esophogram ? ? ?Treatments: surgery:  ? ?01/28/2022 ?  ?Patient:  Gail Salinas ?Pre-Op Dx: Paraesophageal hernia   ?GERD ?Odynophagia   ?Post-op Dx:  same ?Procedure: ?- Esophagoscopy ?- Robotic assisted laparoscopy ?- Paraesophageal hernia repair with Myriad Mesh pledgets ?- Toupet Fundoplication ?   ?Surgeon and Role:   ?   * Lajuana Matte, MD - Primary ?  ?Assistant: Jerilynn Mages. Johnnye Sima, PA-C  ?An experienced assistant was required given the complexity of this surgery and the standard of surgical care. The assistant was needed for exposure, dissection, suctioning, retraction of delicate tissues and sutures, instrument exchange and for overall help during this procedure.   ? ?PATHOLOGY: ? ?Discharge Exam: ?Blood pressure 94/82, pulse 71, temperature 98.2 ?F (36.8 ?C), temperature source Oral, resp. rate 16, height 5'  1.5" (1.562 m), weight 81.6 kg, SpO2 97 %. ? ?General appearance: alert, cooperative, and no distress ?Heart: regular rate and rhythm ?Lungs: clear to auscultation bilaterally ?Abdomen: soft, non-tender; bowel sounds normal;  no masses,  no organomegaly ?Extremities: extremities normal, atraumatic, no cyanosis or edema ?Wound: clean and dry ? ?Discharge disposition: 01-Home or Self Care ? ? ?Allergies as of 01/30/2022   ? ?   Reactions  ? Advair Diskus [fluticasone-salmeterol]   ? Panic attack  ? Codeine Nausea And Vomiting  ? Prednisone   ? Panic attack  ? ?  ? ?  ?Medication List  ?  ? ?TAKE these medications   ? ?acetaminophen 325 MG tablet ?Commonly known as: TYLENOL ?Take 650 mg by mouth every 6 (six) hours as needed for moderate pain. ?  ?aspirin EC 81 MG tablet ?Take 81 mg by mouth daily. ?  ?calcium carbonate 1500 (600 Ca) MG Tabs tablet ?Commonly known as: OSCAL ?Take 1,500 mg by mouth 2 (two) times daily with a meal. ?  ?DULoxetine 30 MG capsule ?Commonly known as: CYMBALTA ?Take 30 mg by mouth daily. ?  ?magnesium gluconate 500 MG tablet ?Commonly known as: MAGONATE ?Take 500 mg by mouth 2 (two) times daily. ?  ?meclizine 25 MG tablet ?Commonly known as: ANTIVERT ?Take 25 mg by mouth 3 (three) times daily as needed for dizziness. ?  ?montelukast 10 MG tablet ?Commonly known as: SINGULAIR ?Take 10 mg by mouth at bedtime. ?  ?omeprazole 20 MG capsule ?Commonly known as: PRILOSEC ?Take 20 mg by mouth daily. ?  ?ondansetron 4 MG tablet ?Commonly known as: Zofran ?Take 1 tablet (4 mg total) by mouth every 8 (eight) hours as needed for nausea or vomiting. ?  ?potassium chloride SA 20 MEQ tablet ?Commonly known as: KLOR-CON M ?Take 20 mEq by mouth 4 (four) times daily. ?  ?traMADol 50 MG tablet ?Commonly known as: ULTRAM ?Take 1 tablet (50 mg total) by mouth every 6 (six) hours as needed for moderate pain. ?  ? ?  ? ? Follow-up Information   ? ? Lajuana Matte, MD Follow up on 02/05/2022.   ?Specialty: Cardiothoracic Surgery ?Why: Appointment is a virtual appointment.  The doctor will call you for a 2:40 appointment ?Contact information: ?[address] ?[place] [address] ?[phone] ? ? ?  ?  ? ?  ?  ? ?   ? ? ?Signed: ? ?Ellwood Handler, PA-C ? ?01/30/2022, 7:55 AM ? ? ?

## 2022-01-29 NOTE — Hospital Course (Addendum)
History of Present Illness: ? ?Clovis Cao 81 y.o. female known to [facility].  She underwent a robotic assisted left lower lobectomy for non-small cell lung cancer in November 2022.  She had a known history of a hiatal hernia.  She has recovered well from her lung surgery.  She has been quite symptomatic from her hiatal hernia for several years as well.  She admits to dysphagia and severe reflux.  She also admits to some odynophagia with meals and with different positions.  She was again evaluated by Dr. Kipp Brood who recommended Robotic repair of her hernia.  The risks and benefits of the procedure were explained to the patient and she was agreeable to proceed. ? ?Hospital Course: ? ?Mathilda Maguire presented to [facility] on 01/28/2022.  She underwent Robotic Assisted Laparoscopy with Paraesophageal hernia repair with Myriad Mesh pledgets, Toupet Fundoplication, and Esophagoscopy.  She tolerated the procedure, was extubated and taken to PACU in stable condition.  The patient remained NPO overnight.  She underwent an Esophogram the morning of POD #1 which showed no evidence of esophageal leak.  She was started on a liquid diet with final progression to a soft diet which would remain for several weeks.  She was hypokalemic and supplemented with IV replacement.  She was restarted on her home medications. The patient tolerated a diet without difficulty. She had an episode of mild diarrhea, but denied N/V.  She is ambulating independently.  She is medically stable for discharge home today. ?

## 2022-01-29 NOTE — Anesthesia Postprocedure Evaluation (Signed)
Anesthesia Post Note ? ?Patient: Gail Salinas ? ?Procedure(s) Performed: XI ROBOTIC ASSISTED PARAESOPHAGEAL HERNIA REPAIR WITH MYRIAD MESH WITH FUNDOPLICATION (Chest) ?ESOPHAGOGASTRODUODENOSCOPY (EGD) (Esophagus) ? ?  ? ?Patient location during evaluation: PACU ?Anesthesia Type: General ?Level of consciousness: sedated and patient cooperative ?Pain management: pain level controlled ?Vital Signs Assessment: post-procedure vital signs reviewed and stable ?Respiratory status: spontaneous breathing ?Cardiovascular status: stable ?Anesthetic complications: no ? ? ?No notable events documented. ? ?Last Vitals:  ?Vitals:  ? 01/29/22 1803 01/29/22 1958  ?BP: 122/62 128/67  ?Pulse: 80 82  ?Resp: (!) 22 20  ?Temp: 37.3 ?C 36.8 ?C  ?SpO2: 100% 95%  ?  ?Last Pain:  ?Vitals:  ? 01/29/22 1958  ?TempSrc: Oral  ?PainSc: 0-No pain  ? ? ?  ?  ?  ?  ?  ?  ? ?Nolon Nations ? ? ? ? ?

## 2022-01-29 NOTE — Discharge Instructions (Signed)
You may shower, wash incisions with soap and water daily and pat dry ?No driving until released by our office and you are no longer taking narcotic pain medication ?Activity- up as tolerated, avoid strenuous activity for several weeks ?Diet- soft diet until advanced by Dr. Kipp Brood ?Questions or concerns, call the office at [phone] ?

## 2022-01-30 LAB — SURGICAL PATHOLOGY

## 2022-01-30 LAB — BASIC METABOLIC PANEL
Anion gap: 8 (ref 5–15)
BUN: 11 mg/dL (ref 8–23)
CO2: 26 mmol/L (ref 22–32)
Calcium: 7.9 mg/dL — ABNORMAL LOW (ref 8.9–10.3)
Chloride: 101 mmol/L (ref 98–111)
Creatinine, Ser: 0.9 mg/dL (ref 0.44–1.00)
GFR, Estimated: >60 mL/min (ref >60)
Glucose, Bld: 126 mg/dL — ABNORMAL HIGH (ref 70–99)
Potassium: 2.8 mmol/L — ABNORMAL LOW (ref 3.5–5.1)
Sodium: 135 mmol/L (ref 135–145)

## 2022-01-30 MED ORDER — POTASSIUM CHLORIDE 20 MEQ PO PACK
40.0000 meq | PACK | Freq: Once | ORAL | Status: AC
  Administered 2022-01-30: 40 meq via ORAL
  Filled 2022-01-30: qty 2

## 2022-01-30 MED ORDER — ONDANSETRON HCL 4 MG PO TABS: 4.0000 mg | ORAL_TABLET | Freq: Three times a day (TID) | ORAL | 0 refills | Status: AC | PRN

## 2022-01-30 NOTE — Progress Notes (Signed)
? ?   ?  [address] ?      [place] [address] ?            [phone]   ? ?  ?2 Days Post-Op Procedure(s) (LRB): ?XI ROBOTIC ASSISTED PARAESOPHAGEAL HERNIA REPAIR WITH MYRIAD MESH WITH FUNDOPLICATION (N/A) ?ESOPHAGOGASTRODUODENOSCOPY (EGD) (N/A) ? ?Subjective: ? ?Patient is sitting up on side of bed.  Doing much better today.  Tolerating diet without difficulty.  She did have some mild diarrhea which she felt was due to sweet potatoes and apple sauce she ate.  Denies N/V ? ?Objective: ?Vital signs in last 24 hours: ?Temp:  [98.2 ?F (36.8 ?C)-99.2 ?F (37.3 ?C)] 98.2 ?F (36.8 ?C) (03/31 0370) ?Pulse Rate:  [71-93] 71 (03/31 0429) ?Cardiac Rhythm: Normal sinus rhythm;Heart block (03/31 0700) ?Resp:  [16-22] 16 (03/31 0738) ?BP: (94-141)/(57-82) 94/82 (03/31 4888) ?SpO2:  [95 %-100 %] 97 % (03/31 0738) ? ?Intake/Output from previous day: ?03/30 0701 - 03/31 0700 ?In: -  ?Out: 300 [Urine:300] ? ?General appearance: alert, cooperative, and no distress ?Heart: regular rate and rhythm ?Lungs: clear to auscultation bilaterally ?Abdomen: soft, non-tender; bowel sounds normal; no masses,  no organomegaly ?Extremities: extremities normal, atraumatic, no cyanosis or edema ?Wound: clean and dry ? ?Lab Results: ?Recent Labs  ?  01/27/22 ?1317 01/29/22 ?0146  ?WBC 8.8 12.4*  ?HGB 13.5 11.4*  ?HCT 40.9 34.6*  ?PLT 331 241  ? ?BMET:  ?Recent Labs  ?  01/29/22 ?0146 01/30/22 ?0101  ?NA 136 135  ?K 2.7* 2.8*  ?CL 96* 101  ?CO2 26 26  ?GLUCOSE 117* 126*  ?BUN 14 11  ?CREATININE 0.80 0.90  ?CALCIUM 8.1* 7.9*  ?  ?PT/INR:  ?Recent Labs  ?  01/27/22 ?1317  ?LABPROT 12.4  ?INR 0.9  ? ?ABG ?   ?Component Value Date/Time  ? PHART 7.48 (H) 01/27/2022 1348  ? HCO3 25.3 01/27/2022 1348  ? ACIDBASEDEF 0.8 09/16/2021 1347  ? O2SAT 98.7 01/27/2022 1348  ? ?CBG (last 3)  ?Recent Labs  ?  01/28/22 ?1445  ?GLUCAP 126*  ? ? ?Assessment/Plan: ?S/P Procedure(s) (LRB): ?XI ROBOTIC ASSISTED PARAESOPHAGEAL HERNIA REPAIR WITH MYRIAD MESH  WITH FUNDOPLICATION (N/A) ?ESOPHAGOGASTRODUODENOSCOPY (EGD) (N/A) ? ?CV- NSR, BP controlled ?Pulm- off oxygen, no issues ?GI- denies N/V, mild diarrhea.. patient instructed she can use immodium as needed ?Dispo- patient stable, will d/c home today ? ? LOS: 2 days  ? ? ?Ellwood Handler, PA-C ?01/30/2022 ? ? ?

## 2022-01-30 NOTE — Plan of Care (Signed)

## 2022-01-30 NOTE — TOC Progression Note (Signed)
Transition of Care (TOC) - Progression Note  ? ? ?Patient Details  ?Name: ARIELL GUNNELS ?MRN: [id number] ?Date of Birth: [birth year] ? ?Transition of Care (TOC) CM/SW Contact  ?Angelita Ingles, RN ?Phone [phone] ? ?01/30/2022, 10:06 AM ? ?Clinical Narrative:    ? ?Transition of Care (TOC) Screening Note ? ? ?Patient Details  ?Name: DONYELL CARRELL ?Date of Birth: [birth year] ? ? ?Transition of Care (TOC) CM/SW Contact:    ?Angelita Ingles, RN ?Phone Number: ?01/30/2022, 10:07 AM ? ? ? ?Transition of Care Department [facility]) has reviewed patient and no TOC needs have been identified at this time. We will continue to monitor patient advancement through interdisciplinary progression rounds. If new patient transition needs arise, please place a TOC consult. ? ? ? ? ?  ?  ? ?Expected Discharge Plan and Services ?  ?  ?  ?  ?  ?Expected Discharge Date: 01/30/22               ?  ?  ?  ?  ?  ?  ?  ?  ?  ?  ? ? ?Social Determinants of Health (SDOH) Interventions ?  ? ?Readmission Risk Interventions ?   ? View : No data to display.  ?  ?  ?  ? ? ?

## 2022-01-30 NOTE — Care Management Important Message (Signed)
Important Message ? ?Patient Details  ?Name: Gail Salinas ?MRN: [id number] ?Date of Birth: [birth year] ? ? ?Medicare Important Message Given:  Yes ? ?Patient left prior to IM delivery will mail IM to the Patient home address.  ? ? ?Shenicka Sunderlin ?01/30/2022, 12:07 PM ?

## 2022-02-03 ENCOUNTER — Other Ambulatory Visit: Payer: Self-pay | Admitting: Surgical

## 2022-02-03 ENCOUNTER — Ambulatory Visit (INDEPENDENT_AMBULATORY_CARE_PROVIDER_SITE_OTHER): Payer: Self-pay | Admitting: Surgical

## 2022-02-03 ENCOUNTER — Telehealth: Payer: Self-pay | Admitting: *Deleted

## 2022-02-03 VITALS — BP 129/90 | HR 94 | Temp 99.3°F | Resp 20 | Ht 61.0 in | Wt 178.0 lb

## 2022-02-03 DIAGNOSIS — Z8719 Personal history of other diseases of the digestive system: Secondary | ICD-10-CM

## 2022-02-03 DIAGNOSIS — Z9889 Other specified postprocedural states: Secondary | ICD-10-CM

## 2022-02-03 DIAGNOSIS — Z4889 Encounter for other specified surgical aftercare: Secondary | ICD-10-CM

## 2022-02-03 MED ORDER — CEPHALEXIN 250 MG PO CAPS: 250.0000 mg | ORAL_CAPSULE | Freq: Three times a day (TID) | ORAL | 0 refills | Status: DC

## 2022-02-03 NOTE — Progress Notes (Signed)
? ?   ?[address] ?      [place] [address] ?            [phone]   ? ?  ?[place] ?[place] Record #[id number] ?Date of Birth: [birth year] ? ?Referring: Dion Body, MD ?Primary Care: Dion Body, MD ?Primary Cardiologist: None ? ? ?Chief Complaint:   POST OP FOLLOW UP ?01/28/2022 ?  ?Patient:  Gail Salinas ?Pre-Op Dx: Paraesophageal hernia   ?GERD ?Odynophagia   ?Post-op Dx:  same ?Procedure: ?- Esophagoscopy ?- Robotic assisted laparoscopy ?- Paraesophageal hernia repair with Myriad Mesh pledgets ?- Toupet Fundoplication ?  ?  ?Surgeon and Role:   ?   * Lajuana Matte, MD - Primary ?  ?Assistant: Jerilynn Mages. Johnnye Sima, PA-C  ?History of Present Illness:    Patient is a 81 year old female that is post the above described procedure.  She is called to the office with some concerns about redness associated with 2 of her incisions.  She has had a low-grade fever of 99.5.  There is some tenderness and warmth associated with it but she feels it is a little bit better today compared to yesterday.  She continues to have diarrhea which she has maintained since hospitalization.  She is taking Imodium for this.  Otherwise, she feels as though she is doing fairly well on a soft diet.  ? ? ? ?  ?Past Medical History:  ?Diagnosis Date  ? Anxiety   ? Cancer ([place]) 09/18/2021  ? COPD (chronic obstructive pulmonary disease) ([place])   ? GERD (gastroesophageal reflux disease)   ? History of hiatal hernia 2022  ? OCD (obsessive compulsive disorder)   ? PONV (postoperative nausea and vomiting)   ? Nauseated after surgery 40 years ago  ? Pre-diabetes   ? ? ? ?Social History  ? ?Tobacco Use  ?Smoking Status Never  ?Smokeless Tobacco Never  ?  ?Social History  ? ?Substance and Sexual Activity  ?Alcohol Use Not Currently  ? Comment: very rarely  ? ? ? ?Allergies  ?Allergen Reactions  ? Advair Diskus [Fluticasone-Salmeterol]   ?  Panic attack  ? Codeine Nausea And Vomiting  ? Prednisone    ?  Panic attack  ? ? ?Current Outpatient Medications  ?Medication Sig Dispense Refill  ? acetaminophen (TYLENOL) 325 MG tablet Take 650 mg by mouth every 6 (six) hours as needed for moderate pain.    ? aspirin EC 81 MG tablet Take 81 mg by mouth daily.    ? calcium carbonate (OSCAL) 1500 (600 Ca) MG TABS tablet Take 1,500 mg by mouth 2 (two) times daily with a meal.    ? DULoxetine (CYMBALTA) 30 MG capsule Take 30 mg by mouth daily.    ? magnesium gluconate (MAGONATE) 500 MG tablet Take 500 mg by mouth 2 (two) times daily.    ? meclizine (ANTIVERT) 25 MG tablet Take 25 mg by mouth 3 (three) times daily as needed for dizziness.    ? montelukast (SINGULAIR) 10 MG tablet Take 10 mg by mouth at bedtime.    ? omeprazole (PRILOSEC) 20 MG capsule Take 20 mg by mouth daily.    ? ondansetron (ZOFRAN) 4 MG tablet Take 1 tablet (4 mg total) by mouth every 8 (eight) hours as needed for nausea or vomiting. 20 tablet 0  ? potassium chloride SA (KLOR-CON) 20 MEQ tablet Take 20 mEq by mouth 4 (four) times daily.    ? traMADol (ULTRAM) 50 MG tablet  Take 1 tablet (50 mg total) by mouth every 6 (six) hours as needed for moderate pain. 30 tablet 0  ? ?No current facility-administered medications for this visit.  ? ? ? ? ? ?Physical Exam: ?Ht 5' 1"  (1.549 m)   BMI 34.01 kg/m?  ? ?General appearance: alert, cooperative, and no distress ?Heart: regular rate and rhythm ?Lungs: Minimally diminished in the bases ?Abdomen: Soft nontender ?Wound: 2 of the port site incisions show surrounding erythema with mild tenderness.   ? ? ?Diagnostic Studies & Laboratory data: ?   ? Recent Radiology Findings:  ? No results found. ?  ? ?Recent Lab Findings: ?Lab Results  ?Component Value Date  ? WBC 12.4 (H) 01/29/2022  ? HGB 11.4 (L) 01/29/2022  ? HCT 34.6 (L) 01/29/2022  ? PLT 241 01/29/2022  ? GLUCOSE 126 (H) 01/30/2022  ? ALT 13 01/27/2022  ? AST 18 01/27/2022  ? NA 135 01/30/2022  ? K 2.8 (L) 01/30/2022  ? CL 101 01/30/2022  ? CREATININE 0.90  01/30/2022  ? BUN 11 01/30/2022  ? CO2 26 01/30/2022  ? INR 0.9 01/27/2022  ? HGBA1C 5.4 09/16/2021  ? ? ? ? ?Assessment / Plan: Minor early suture reaction versus cellulitis.  As there is some associated warmth and tenderness with low-grade temperature and mesh as part of the hernia repair, I feel it is warranted to use oral Keflex.  She has a scheduled virtual visit in 2 days with Dr. Kipp Brood and will discuss progress with him.  Will not schedule a follow-up appointment at this time as I will be determined by Dr. Kipp Brood when best suited. ?   ? ? ?Medication Changes: ?No orders of the defined types were placed in this encounter. ? ? ? ? ?John Giovanni, PA-C  ?02/03/2022 1:30 PM ? ? ? ? ? ?  ?

## 2022-02-03 NOTE — Telephone Encounter (Signed)
Gail Salinas called the office c/o oozing, redness, and swelling at two surgical sites. Per patient the swelling seems to have gone down slightly from yesterday. Patient describes drainage as reddish brown. Patient states sites are warm to touch. Appt scheduled to see one of our PA's in clinic today to evaluate incisions. Patient aware of appt time.  ?

## 2022-02-03 NOTE — Patient Instructions (Signed)
Antibiotics as prescribed ?

## 2022-02-05 ENCOUNTER — Ambulatory Visit (INDEPENDENT_AMBULATORY_CARE_PROVIDER_SITE_OTHER): Payer: Self-pay | Admitting: Thoracic Surgery (Cardiothoracic Vascular Surgery)

## 2022-02-05 DIAGNOSIS — Z8719 Personal history of other diseases of the digestive system: Secondary | ICD-10-CM

## 2022-02-05 DIAGNOSIS — Z9889 Other specified postprocedural states: Secondary | ICD-10-CM

## 2022-02-05 NOTE — Progress Notes (Signed)
?   ?  [address] ?      [place] [address] ?            [phone]      ? ?Patient: Home ?Provider: Office ?Consent for Telemedicine visit obtained. ? ?Today?s visit was completed via a real-time telehealth (see specific modality noted below). The patient/authorized person provided oral consent at the time of the visit to engage in a telemedicine encounter with the present provider at [facility]. The patient/authorized person was informed of the potential benefits, limitations, and risks of telemedicine. The patient/authorized person expressed understanding that the laws that protect confidentiality also apply to telemedicine. The patient/authorized person acknowledged understanding that telemedicine does not provide emergency services and that he or she would need to call 911 or proceed to the nearest hospital for help if such a need arose. ? ? Total time spent in the clinical discussion 10 minutes. ? Telehealth Modality: Phone visit (audio only) ? ?I had a telephone visit with Gail Salinas.  She is status post paraesophageal hernia repair.  She was seen in clinic earlier this week for concern of one of the incisions.  She was started on Keflex.  She had some old blood oozing from the site last night.  She has not had any reflux.  She is remained on the soft diet but is ready to advance it.  I will see her back in 1 month with a chest x-ray. ? ?

## 2022-03-05 ENCOUNTER — Other Ambulatory Visit: Payer: Self-pay | Admitting: Thoracic Surgery (Cardiothoracic Vascular Surgery)

## 2022-03-05 DIAGNOSIS — Z8719 Personal history of other diseases of the digestive system: Secondary | ICD-10-CM

## 2022-03-05 NOTE — Progress Notes (Unsigned)
r 

## 2022-03-06 ENCOUNTER — Ambulatory Visit
Admission: RE | Admit: 2022-03-06 | Discharge: 2022-03-06 | Disposition: A | Discharge status: Home / Self Care | Payer: Medicare Other | Source: Ambulatory Visit | Attending: Thoracic Surgery (Cardiothoracic Vascular Surgery) | Admitting: Thoracic Surgery (Cardiothoracic Vascular Surgery)

## 2022-03-06 ENCOUNTER — Ambulatory Visit (INDEPENDENT_AMBULATORY_CARE_PROVIDER_SITE_OTHER): Payer: Self-pay | Admitting: Thoracic Surgery (Cardiothoracic Vascular Surgery)

## 2022-03-06 VITALS — BP 138/78 | HR 82 | Resp 20 | Ht 61.0 in | Wt 177.0 lb

## 2022-03-06 DIAGNOSIS — Z8719 Personal history of other diseases of the digestive system: Secondary | ICD-10-CM

## 2022-03-06 DIAGNOSIS — Z9889 Other specified postprocedural states: Secondary | ICD-10-CM

## 2022-03-06 NOTE — Progress Notes (Signed)
? ?   ?  Gail Salinas [address] ?      Gail Salinas [address] ?            [phone]       ? ?[place] ?Gail Salinas Record #[id number] ?Date of Birth: November 07, 1940 ? ?Referring: Gail Nash, DO ?Primary Care: Gail Body, MD ?Primary Cardiologist:None ? ?Reason for visit:   follow-up ? ?History of Present Illness:     ?81 year old female that comes in for her 1 month follow-up appointment after undergoing a robotic assisted paraesophageal hernia repair.  Her only complaint is excessive belching and flatulence.  She denies any reflux or dysphagia. ? ?Physical Exam: ?BP 138/78 (BP Location: Left Arm, Patient Position: Sitting)   Pulse 82   Resp 20   Ht 5\' 1"  (1.549 m)   Wt 177 lb (80.3 kg)   SpO2 97% Comment: RA  BMI 33.44 kg/m?  ? ?Alert NAD ?Incision healing well.  There is a scab over the lateral incision, and the left hand port incision opened up slightly.   ?Abdomen, ND ?No peripheral edema ? ? ?Diagnostic Studies & Laboratory data: ?CXR: Clear ? ?  ? ?Assessment / Plan:   ?81 year old female status post paraesophageal hernia repair.  She also has a history of stage I cancer status post left lower lobectomy in November 2022.  She will continue surveillance for her lung cancer, and I will touch base with her in regards to her digestive symptoms in 3 months. ? ? ? ?Gail Salinas ?03/06/2022 10:43 AM ? ? ? ? ? ? ?

## 2022-04-07 ENCOUNTER — Ambulatory Visit
Admission: RE | Admit: 2022-04-07 | Discharge: 2022-04-07 | Disposition: A | Discharge status: Home / Self Care | Payer: Medicare Other | Source: Ambulatory Visit | Attending: Family Medicine | Admitting: Family Medicine

## 2022-04-07 DIAGNOSIS — Z1231 Encounter for screening mammogram for malignant neoplasm of breast: Secondary | ICD-10-CM | POA: Diagnosis not present

## 2022-04-27 ENCOUNTER — Encounter: Payer: Self-pay | Admitting: *Deleted

## 2022-05-25 ENCOUNTER — Other Ambulatory Visit: Payer: Self-pay

## 2022-05-25 ENCOUNTER — Ambulatory Visit (HOSPITAL_COMMUNITY)
Admission: RE | Admit: 2022-05-25 | Discharge: 2022-05-25 | Disposition: A | Discharge status: Home / Self Care | Payer: Medicare Other | Source: Ambulatory Visit | Attending: Physician Assistant | Admitting: Physician Assistant

## 2022-05-25 ENCOUNTER — Inpatient Hospital Stay: Payer: Medicare Other | Attending: Internal Medicine

## 2022-05-25 DIAGNOSIS — C3432 Malignant neoplasm of lower lobe, left bronchus or lung: Secondary | ICD-10-CM | POA: Insufficient documentation

## 2022-05-25 DIAGNOSIS — Z79899 Other long term (current) drug therapy: Secondary | ICD-10-CM | POA: Insufficient documentation

## 2022-05-25 DIAGNOSIS — Z85118 Personal history of other malignant neoplasm of bronchus and lung: Secondary | ICD-10-CM | POA: Insufficient documentation

## 2022-05-25 DIAGNOSIS — K219 Gastro-esophageal reflux disease without esophagitis: Secondary | ICD-10-CM | POA: Insufficient documentation

## 2022-05-25 DIAGNOSIS — Z7982 Long term (current) use of aspirin: Secondary | ICD-10-CM | POA: Insufficient documentation

## 2022-05-25 DIAGNOSIS — J449 Chronic obstructive pulmonary disease, unspecified: Secondary | ICD-10-CM | POA: Insufficient documentation

## 2022-05-25 DIAGNOSIS — M47814 Spondylosis without myelopathy or radiculopathy, thoracic region: Secondary | ICD-10-CM | POA: Insufficient documentation

## 2022-05-25 LAB — CBC WITH DIFFERENTIAL (CANCER CENTER ONLY)
Abs Immature Granulocytes: 0.01 10*3/uL (ref 0.00–0.07)
Basophils Absolute: 0 10*3/uL (ref 0.0–0.1)
Basophils Relative: 0 %
Eosinophils Absolute: 0.2 10*3/uL (ref 0.0–0.5)
Eosinophils Relative: 3 %
HCT: 39.3 % (ref 36.0–46.0)
Hemoglobin: 13.1 g/dL (ref 12.0–15.0)
Immature Granulocytes: 0 %
Lymphocytes Relative: 21 %
Lymphs Abs: 1.6 10*3/uL (ref 0.7–4.0)
MCH: 28.4 pg (ref 26.0–34.0)
MCHC: 33.3 g/dL (ref 30.0–36.0)
MCV: 85.2 fL (ref 80.0–100.0)
Monocytes Absolute: 0.6 10*3/uL (ref 0.1–1.0)
Monocytes Relative: 8 %
Neutro Abs: 5.1 10*3/uL (ref 1.7–7.7)
Neutrophils Relative %: 68 %
Platelet Count: 307 10*3/uL (ref 150–400)
RBC: 4.61 MIL/uL (ref 3.87–5.11)
RDW: 14.8 % (ref 11.5–15.5)
WBC Count: 7.5 10*3/uL (ref 4.0–10.5)
nRBC: 0 % (ref 0.0–0.2)

## 2022-05-25 LAB — CMP (CANCER CENTER ONLY)
ALT: 10 U/L (ref 0–44)
AST: 14 U/L — ABNORMAL LOW (ref 15–41)
Albumin: 4.1 g/dL (ref 3.5–5.0)
Alkaline Phosphatase: 77 U/L (ref 38–126)
Anion gap: 8 (ref 5–15)
BUN: 13 mg/dL (ref 8–23)
CO2: 28 mmol/L (ref 22–32)
Calcium: 9.5 mg/dL (ref 8.9–10.3)
Chloride: 104 mmol/L (ref 98–111)
Creatinine: 0.86 mg/dL (ref 0.44–1.00)
GFR, Estimated: >60 mL/min (ref >60)
Glucose, Bld: 98 mg/dL (ref 70–99)
Potassium: 3.7 mmol/L (ref 3.5–5.1)
Sodium: 140 mmol/L (ref 135–145)
Total Bilirubin: 0.4 mg/dL (ref 0.3–1.2)
Total Protein: 6.9 g/dL (ref 6.5–8.1)

## 2022-05-25 MED ORDER — IOHEXOL 300 MG/ML  SOLN
100.0000 mL | Freq: Once | INTRAMUSCULAR | Status: AC | PRN
  Administered 2022-05-25: 75 mL via INTRAVENOUS

## 2022-05-25 MED ORDER — SODIUM CHLORIDE (PF) 0.9 % IJ SOLN
INTRAMUSCULAR | Status: AC
  Filled 2022-05-25: qty 50

## 2022-05-27 ENCOUNTER — Inpatient Hospital Stay (HOSPITAL_BASED_OUTPATIENT_CLINIC_OR_DEPARTMENT_OTHER): Payer: Medicare Other | Admitting: Internal Medicine

## 2022-05-27 ENCOUNTER — Other Ambulatory Visit: Payer: Self-pay

## 2022-05-27 VITALS — BP 134/80 | HR 91 | Temp 97.9°F | Resp 16 | Wt 180.4 lb

## 2022-05-27 DIAGNOSIS — J449 Chronic obstructive pulmonary disease, unspecified: Secondary | ICD-10-CM | POA: Diagnosis not present

## 2022-05-27 DIAGNOSIS — Z7982 Long term (current) use of aspirin: Secondary | ICD-10-CM | POA: Diagnosis not present

## 2022-05-27 DIAGNOSIS — C3490 Malignant neoplasm of unspecified part of unspecified bronchus or lung: Secondary | ICD-10-CM | POA: Diagnosis not present

## 2022-05-27 DIAGNOSIS — K219 Gastro-esophageal reflux disease without esophagitis: Secondary | ICD-10-CM | POA: Diagnosis not present

## 2022-05-27 DIAGNOSIS — M47814 Spondylosis without myelopathy or radiculopathy, thoracic region: Secondary | ICD-10-CM | POA: Diagnosis not present

## 2022-05-27 DIAGNOSIS — Z85118 Personal history of other malignant neoplasm of bronchus and lung: Secondary | ICD-10-CM | POA: Diagnosis present

## 2022-05-27 DIAGNOSIS — Z79899 Other long term (current) drug therapy: Secondary | ICD-10-CM | POA: Diagnosis not present

## 2022-05-27 NOTE — Progress Notes (Signed)
[place] Telephone:(336) [phone]   Fax:(336) [phone]  OFFICE PROGRESS NOTE  Dion Body, MD [place] [address]  DIAGNOSIS:  Stage IA (T1b, N0, M0) non-small cell lung cancer, adenocarcinoma diagnosed in November 2022  PRIOR THERAPY: Status post robotic assisted left lower lobe wedge resection followed by left lower lobectomy on September 18, 2021 under the care of Dr. Kipp Brood  CURRENT THERAPY: Observation.  INTERVAL HISTORY: Gail Salinas 81 y.o. female returns to the clinic today for 39-monthfollow-up visit.  The patient is feeling fine today with no concerning complaints except for the baseline shortness of breath increased with exertion.  She denied having any current chest pain, cough or hemoptysis.  She has no nausea, vomiting, diarrhea or constipation.  She has no headache or visual changes.  She denied having any recent weight loss or night sweats.  She is here today for evaluation with repeat CT scan of the chest for restaging of her disease.  MEDICAL HISTORY: Past Medical History:  Diagnosis Date   Anxiety    Cancer ([place] 09/18/2021   COPD (chronic obstructive pulmonary disease) (HCC)    GERD (gastroesophageal reflux disease)    History of hiatal hernia 2022   OCD (obsessive compulsive disorder)    PONV (postoperative nausea and vomiting)    Nauseated after surgery 40 years ago   Pre-diabetes     ALLERGIES:  is allergic to advair diskus [fluticasone-salmeterol], codeine, and prednisone.  MEDICATIONS:  Current Outpatient Medications  Medication Sig Dispense Refill   acetaminophen (TYLENOL) 325 MG tablet Take 650 mg by mouth every 6 (six) hours as needed for moderate pain.     aspirin EC 81 MG tablet Take 81 mg by mouth daily.     Ca Carbonate-Mag Hydroxide (ROLAIDS PO) Take by mouth.     calcium carbonate (OSCAL) 1500 (600 Ca) MG TABS tablet Take 1,500 mg by mouth 2 (two) times daily with  a meal.     DULoxetine (CYMBALTA) 30 MG capsule Take 30 mg by mouth daily.     magnesium gluconate (MAGONATE) 500 MG tablet Take 500 mg by mouth 2 (two) times daily.     meclizine (ANTIVERT) 25 MG tablet Take 25 mg by mouth 3 (three) times daily as needed for dizziness.     montelukast (SINGULAIR) 10 MG tablet Take 10 mg by mouth at bedtime.     omeprazole (PRILOSEC) 20 MG capsule Take 20 mg by mouth daily.     ondansetron (ZOFRAN) 4 MG tablet Take 1 tablet (4 mg total) by mouth every 8 (eight) hours as needed for nausea or vomiting. 20 tablet 0   potassium chloride SA (KLOR-CON) 20 MEQ tablet Take 20 mEq by mouth 4 (four) times daily.     No current facility-administered medications for this visit.    SURGICAL HISTORY:  Past Surgical History:  Procedure Laterality Date   ABDOMINAL HYSTERECTOMY     BREAST BIOPSY Left 2015   Negative-Fibrocystic changes   BRONCHIAL BRUSHINGS  09/18/2021   Procedure: BRONCHIAL BRUSHINGS;  Surgeon: IGarner Nash DO;  [place] [facility]  Service: Pulmonary;;   BRONCHIAL NEEDLE ASPIRATION BIOPSY  09/18/2021   Procedure: BRONCHIAL NEEDLE ASPIRATION BIOPSIES;  Surgeon: IGarner Nash DO;  [place] [facility]  Service: Pulmonary;;   COLONOSCOPY WITH PROPOFOL N/A 05/19/2018   Procedure: COLONOSCOPY WITH PROPOFOL;  Surgeon: SLollie Sails MD;  Location: [facility];  Service: Endoscopy;  Laterality: N/A;  ESOPHAGOGASTRODUODENOSCOPY N/A 01/28/2022   Procedure: ESOPHAGOGASTRODUODENOSCOPY (EGD);  Surgeon: Lajuana Matte, MD;  Location: [place];  Service: Thoracic;  Laterality: N/A;   FIDUCIAL MARKER PLACEMENT  09/18/2021   Procedure: FIDUCIAL DYE MARKING;  Surgeon: Garner Nash, DO;  Location: [facility];  Service: Pulmonary;;   INTERCOSTAL NERVE BLOCK  09/18/2021   Procedure: INTERCOSTAL NERVE BLOCK;  Surgeon: Lajuana Matte, MD;  Location: [facility];  Service: Thoracic;;   LOBECTOMY  09/18/2021   Procedure: LEFT LOWER  LOBECTOMY;  Surgeon: Lajuana Matte, MD;  Location: [facility];  Service: Thoracic;;   LYMPH NODE DISSECTION  09/18/2021   Procedure: LYMPH NODE DISSECTION;  Surgeon: Lajuana Matte, MD;  Location: [place];  Service: Thoracic;;   OOPHORECTOMY     TONSILLECTOMY     TUMOR EXCISION Left    pt states she had a 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 50+ years ago   [place] Left 09/18/2021   Procedure: VIDEO BRONCHOSCOPY WITH ENDOBRONCHIAL NAVIGATION;  Surgeon: Garner Nash, DO;  Location: [facility];  Service: Pulmonary;  Laterality: Left;  ION w/ MB:ICG Fiducial Dye Marking   XI ROBOTIC ASSISTED PARAESOPHAGEAL HERNIA REPAIR N/A 01/28/2022   Procedure: XI ROBOTIC ASSISTED PARAESOPHAGEAL HERNIA REPAIR WITH MYRIAD MESH WITH FUNDOPLICATION;  Surgeon: Lajuana Matte, MD;  Location: [place];  Service: Thoracic;  Laterality: N/A;    REVIEW OF SYSTEMS:  A comprehensive review of systems was negative except for: Respiratory: positive for dyspnea on exertion   PHYSICAL EXAMINATION: General appearance: alert, cooperative, and no distress Head: Normocephalic, without obvious abnormality, atraumatic Neck: no adenopathy, no JVD, supple, symmetrical, trachea midline, and thyroid not enlarged, symmetric, no tenderness/mass/nodules Lymph nodes: Cervical, supraclavicular, and axillary nodes normal. Resp: clear to auscultation bilaterally Back: symmetric, no curvature. ROM normal. No CVA tenderness. Cardio: regular rate and rhythm, S1, S2 normal, no murmur, click, rub or gallop GI: soft, non-tender; bowel sounds normal; no masses,  no organomegaly Extremities: extremities normal, atraumatic, no cyanosis or edema  ECOG PERFORMANCE STATUS: 1 - Symptomatic but completely ambulatory  Blood pressure 134/80, pulse 91, temperature 97.9 F (36.6 C), temperature source Temporal, resp. rate 16, weight 180 lb 6 oz (81.8 kg), SpO2 98 %.  LABORATORY DATA: Lab Results   Component Value Date   WBC 7.5 05/25/2022   HGB 13.1 05/25/2022   HCT 39.3 05/25/2022   MCV 85.2 05/25/2022   PLT 307 05/25/2022      Chemistry      Component Value Date/Time   NA 140 05/25/2022 1300   NA 137 01/18/2014 0530   K 3.7 05/25/2022 1300   K 3.4 (L) 01/18/2014 0530   CL 104 05/25/2022 1300   CL 104 01/18/2014 0530   CO2 28 05/25/2022 1300   CO2 25 01/18/2014 0530   BUN 13 05/25/2022 1300   BUN 13 01/18/2014 0530   CREATININE 0.86 05/25/2022 1300   CREATININE 0.69 01/18/2014 0530      Component Value Date/Time   CALCIUM 9.5 05/25/2022 1300   CALCIUM 9.0 01/18/2014 0530   ALKPHOS 77 05/25/2022 1300   ALKPHOS 89 01/15/2014 0654   AST 14 (L) 05/25/2022 1300   ALT 10 05/25/2022 1300   ALT 14 01/15/2014 0654   BILITOT 0.4 05/25/2022 1300       RADIOGRAPHIC STUDIES: CT Chest W Contrast  Result Date: 05/26/2022 CLINICAL DATA:  Non-small cell lung cancer restaging, prior left lower lobectomy. * Tracking Code: BO * EXAM:  CT CHEST WITH CONTRAST TECHNIQUE: Multidetector CT imaging of the chest was performed during intravenous contrast administration. RADIATION DOSE REDUCTION: This exam was performed according to the departmental dose-optimization program which includes automated exposure control, adjustment of the mA and/or kV according to patient size and/or use of iterative reconstruction technique. CONTRAST:  39m OMNIPAQUE IOHEXOL 300 MG/ML  SOLN COMPARISON:  09/04/2021 FINDINGS: Cardiovascular: Coronary, aortic arch, and branch vessel atherosclerotic vascular disease. Mediastinum/Nodes: Interval hiatal hernia repair with fundoplication. No pathologic adenopathy. Lungs/Pleura: Interval left lower lobectomy. Mild scarring posteriorly at the left lung base. 2 by 3 mm right middle lobe nodule on image 103 series 5 and image 48 series 6, no change from 11/30/2013, considered benign. Upper Abdomen: Unremarkable Musculoskeletal: Degenerative glenohumeral arthropathy bilaterally  with calcifications along both glenohumeral joint capsules. Thoracic spondylosis IMPRESSION: 1. Interval left lower lobectomy. No residual malignancy identified. 2. Other imaging findings of potential clinical significance: Aortic Atherosclerosis (ICD10-I70.0). Coronary and systemic atherosclerosis. Interval hiatal hernia repair with fundoplication. Degenerative glenohumeral arthropathy with calcifications along both glenohumeral joint capsules. Thoracic spondylosis. Electronically Signed   By: WVan ClinesM.D.   On: 05/26/2022 08:47    ASSESSMENT AND PLAN: This is a very pleasant 81years old white female with Stage IA (T1b, N0, M0) non-small cell lung cancer, adenocarcinoma diagnosed in November 2022.  She is status post robotic assisted left lower lobe wedge resection followed by left lower lobectomy on September 18, 2021 under the care of Dr. LKipp BroodThe patient is currently on observation and she is feeling fine with no concerning complaints. She had repeat CT scan of the chest performed recently.  I personally and independently reviewed the scans and discussed the result with the patient today. Her scan showed no concerning findings for disease recurrence or metastasis. I recommended for the patient to continue on observation with repeat CT scan of the chest in 6 months. She was advised to call immediately if she has any concerning symptoms in the interval. The patient voices understanding of current disease status and treatment options and is in agreement with the current care plan.  All questions were answered. The patient knows to call the clinic with any problems, questions or concerns. We can certainly see the patient much sooner if necessary.  The total time spent in the appointment was 20 minutes.  Disclaimer: This note was dictated with voice recognition software. Similar sounding words can inadvertently be transcribed and may not be corrected upon review.

## 2022-05-27 NOTE — Addendum Note (Signed)
Addended by: Ardeen Garland on: 05/27/2022 02:28 PM   Modules accepted: Orders

## 2022-06-12 ENCOUNTER — Ambulatory Visit (INDEPENDENT_AMBULATORY_CARE_PROVIDER_SITE_OTHER): Payer: Medicare Other | Admitting: Thoracic Surgery (Cardiothoracic Vascular Surgery)

## 2022-06-12 DIAGNOSIS — C3492 Malignant neoplasm of unspecified part of left bronchus or lung: Secondary | ICD-10-CM | POA: Diagnosis not present

## 2022-06-12 DIAGNOSIS — Z8719 Personal history of other diseases of the digestive system: Secondary | ICD-10-CM | POA: Diagnosis not present

## 2022-06-12 DIAGNOSIS — Z9889 Other specified postprocedural states: Secondary | ICD-10-CM | POA: Diagnosis not present

## 2022-06-12 NOTE — Progress Notes (Signed)
     [address]       [place] [address]             [phone]       Patient: Home Provider: Office Consent for Telemedicine visit obtained.  Today's visit was completed via a real-time telehealth (see specific modality noted below). The patient/authorized person provided oral consent at the time of the visit to engage in a telemedicine encounter with the present provider at [facility]. The patient/authorized person was informed of the potential benefits, limitations, and risks of telemedicine. The patient/authorized person expressed understanding that the laws that protect confidentiality also apply to telemedicine. The patient/authorized person acknowledged understanding that telemedicine does not provide emergency services and that he or she would need to call 911 or proceed to the nearest hospital for help if such a need arose.   Total time spent in the clinical discussion 10 minutes.  Telehealth Modality: Phone visit (audio only)  I had a telephone visit with Gail Salinas.  She is doing well from a reflux standpoint.  She has not taken her reflux medication in over a month.  She denies any dysphagia or odynophagia.  She recently had her surveillance CT scan and it is stable as well.  She will continue her surveillance through the cancer center, will follow-up with [place] with any digestive symptoms.

## 2022-10-06 ENCOUNTER — Other Ambulatory Visit: Payer: Self-pay

## 2022-10-06 ENCOUNTER — Ambulatory Visit
Admission: RE | Admit: 2022-10-06 | Discharge: 2022-10-06 | Disposition: A | Discharge status: Home / Self Care | Payer: Medicare Other | Attending: Gastroenterology | Admitting: Gastroenterology

## 2022-10-06 ENCOUNTER — Encounter: Payer: Self-pay | Admitting: *Deleted

## 2022-10-06 ENCOUNTER — Ambulatory Visit: Payer: Medicare Other | Admitting: Anesthesiology

## 2022-10-06 ENCOUNTER — Encounter
Admission: RE | Disposition: A | Discharge status: Home / Self Care | Payer: Self-pay | Source: Home / Self Care | Attending: Gastroenterology

## 2022-10-06 DIAGNOSIS — K219 Gastro-esophageal reflux disease without esophagitis: Secondary | ICD-10-CM | POA: Insufficient documentation

## 2022-10-06 DIAGNOSIS — F32A Depression, unspecified: Secondary | ICD-10-CM | POA: Insufficient documentation

## 2022-10-06 DIAGNOSIS — K648 Other hemorrhoids: Secondary | ICD-10-CM | POA: Diagnosis not present

## 2022-10-06 DIAGNOSIS — K5730 Diverticulosis of large intestine without perforation or abscess without bleeding: Secondary | ICD-10-CM | POA: Insufficient documentation

## 2022-10-06 DIAGNOSIS — Z800 Family history of malignant neoplasm of digestive organs: Secondary | ICD-10-CM | POA: Diagnosis not present

## 2022-10-06 DIAGNOSIS — R194 Change in bowel habit: Secondary | ICD-10-CM | POA: Insufficient documentation

## 2022-10-06 DIAGNOSIS — K640 First degree hemorrhoids: Secondary | ICD-10-CM | POA: Insufficient documentation

## 2022-10-06 DIAGNOSIS — J449 Chronic obstructive pulmonary disease, unspecified: Secondary | ICD-10-CM | POA: Insufficient documentation

## 2022-10-06 HISTORY — PX: COLONOSCOPY WITH PROPOFOL: SHX5780

## 2022-10-06 SURGERY — COLONOSCOPY WITH PROPOFOL
Anesthesia: General

## 2022-10-06 MED ORDER — PROPOFOL 10 MG/ML IV BOLUS
INTRAVENOUS | Status: DC | PRN
Start: 2022-10-06 — End: 2022-10-06
  Administered 2022-10-06: 70 mg via INTRAVENOUS

## 2022-10-06 MED ORDER — SODIUM CHLORIDE 0.9 % IV SOLN: INTRAVENOUS | Status: DC

## 2022-10-06 MED ORDER — PROPOFOL 1000 MG/100ML IV EMUL
INTRAVENOUS | Status: AC
  Filled 2022-10-06: qty 100

## 2022-10-06 MED ORDER — PROPOFOL 500 MG/50ML IV EMUL
INTRAVENOUS | Status: DC | PRN
  Administered 2022-10-06: 150 ug/kg/min via INTRAVENOUS

## 2022-10-06 MED ORDER — LIDOCAINE HCL (CARDIAC) PF 100 MG/5ML IV SOSY
PREFILLED_SYRINGE | INTRAVENOUS | Status: DC | PRN
  Administered 2022-10-06: 50 mg via INTRAVENOUS

## 2022-10-06 NOTE — Anesthesia Preprocedure Evaluation (Signed)
Anesthesia Evaluation  Patient identified by MRN, date of birth, ID band Patient awake    Reviewed: Allergy & Precautions, NPO status , Patient's Chart, lab work & pertinent test results  History of Anesthesia Complications (+) PONV and history of anesthetic complications  Airway Mallampati: III  TM Distance: <3 FB Neck ROM: full    Dental  (+) Chipped   Pulmonary shortness of breath and with exertion, COPD   Pulmonary exam normal        Cardiovascular Exercise Tolerance: Good (-) angina negative cardio ROS Normal cardiovascular exam     Neuro/Psych  PSYCHIATRIC DISORDERS      negative neurological ROS     GI/Hepatic Neg liver ROS, hiatal hernia,GERD  Controlled,,  Endo/Other  negative endocrine ROS    Renal/GU negative Renal ROS  negative genitourinary   Musculoskeletal   Abdominal   Peds  Hematology negative hematology ROS (+)   Anesthesia Other Findings Past Medical History: No date: Anxiety 09/18/2021: Cancer ([place]) No date: COPD (chronic obstructive pulmonary disease) (HCC) No date: GERD (gastroesophageal reflux disease) 2022: History of hiatal hernia No date: OCD (obsessive compulsive disorder) No date: PONV (postoperative nausea and vomiting)     Comment:  Nauseated after surgery 40 years ago No date: Pre-diabetes  Past Surgical History: No date: ABDOMINAL HYSTERECTOMY 2015: BREAST BIOPSY; Left     Comment:  Negative-Fibrocystic changes 09/18/2021: BRONCHIAL BRUSHINGS     Comment:  Procedure: BRONCHIAL BRUSHINGS;  Surgeon: Garner Nash, DO;  Location: [place] [facility];  Service: Pulmonary;; 09/18/2021: BRONCHIAL NEEDLE ASPIRATION BIOPSY     Comment:  Procedure: BRONCHIAL NEEDLE ASPIRATION BIOPSIES;                Surgeon: Garner Nash, DO;  Location: [facility];                Service: Pulmonary;; 05/19/2018: COLONOSCOPY WITH PROPOFOL; N/A     Comment:  Procedure: COLONOSCOPY  WITH PROPOFOL;  Surgeon:               Lollie Sails, MD;  Location: [facility];                Service: Endoscopy;  Laterality: N/A; 01/28/2022: ESOPHAGOGASTRODUODENOSCOPY; N/A     Comment:  Procedure: ESOPHAGOGASTRODUODENOSCOPY (EGD);  Surgeon:               Lajuana Matte, MD;  Location: [facility];  Service:               Thoracic;  Laterality: N/A; 09/18/2021: FIDUCIAL MARKER PLACEMENT     Comment:  Procedure: FIDUCIAL DYE MARKING;  Surgeon: Garner Nash, DO;  Location: [facility];  Service:               Pulmonary;; 09/18/2021: INTERCOSTAL NERVE BLOCK     Comment:  Procedure: INTERCOSTAL NERVE BLOCK;  Surgeon: Lajuana Matte, MD;  Location: [facility];  Service: Thoracic;; 09/18/2021: LOBECTOMY     Comment:  Procedure: LEFT LOWER LOBECTOMY;  Surgeon: Lajuana Matte, MD;  Location: [facility];  Service: Thoracic;; 09/18/2021: LYMPH NODE DISSECTION     Comment:  Procedure: LYMPH NODE DISSECTION;  Surgeon: Kipp Brood,  Lucile Crater, MD;  Location: [place];  Service: Thoracic;; No date: OOPHORECTOMY No date: TONSILLECTOMY No date: TUMOR EXCISION; Left     Comment:  pt states she had a fatty tumor removed from her left               jaw 50+ years ago 09/18/2021: [place]; Left     Comment:  Procedure: VIDEO BRONCHOSCOPY WITH ENDOBRONCHIAL               NAVIGATION;  Surgeon: Garner Nash, DO;  Location: [facility] [place];  Service: Pulmonary;  Laterality: Left;  ION               w/ MB:ICG Fiducial Dye Marking 01/28/2022: XI ROBOTIC ASSISTED PARAESOPHAGEAL HERNIA REPAIR; N/A     Comment:  Procedure: XI ROBOTIC ASSISTED PARAESOPHAGEAL HERNIA               REPAIR WITH MYRIAD MESH WITH FUNDOPLICATION;  Surgeon:               Lajuana Matte, MD;  Location: [place];  Service:               Thoracic;  Laterality: N/A;     Reproductive/Obstetrics negative OB ROS                              Anesthesia Physical Anesthesia Plan  ASA: 3  Anesthesia Plan: General   Post-op Pain Management:    Induction: Intravenous  PONV Risk Score and Plan: Propofol infusion and TIVA  Airway Management Planned: Natural Airway and Nasal Cannula  Additional Equipment:   Intra-op Plan:   Post-operative Plan:   Informed Consent: I have reviewed the patients History and Physical, chart, labs and discussed the procedure including the risks, benefits and alternatives for the proposed anesthesia with the patient or authorized representative who has indicated his/her understanding and acceptance.     Dental Advisory Given  Plan Discussed with: Anesthesiologist, CRNA and Surgeon  Anesthesia Plan Comments: (Patient consented for risks of anesthesia including but not limited to:  - adverse reactions to medications - risk of airway placement if required - damage to eyes, teeth, lips or other oral mucosa - nerve damage due to positioning  - sore throat or hoarseness - Damage to heart, brain, nerves, lungs, other parts of body or loss of life  Patient voiced understanding.)       Anesthesia Quick Evaluation

## 2022-10-06 NOTE — Interval H&P Note (Signed)
History and Physical Interval Note:  10/06/2022 1:39 PM  Gail Salinas  has presented today for surgery, with the diagnosis of Change in bowel habits.  The various methods of treatment have been discussed with the patient and family. After consideration of risks, benefits and other options for treatment, the patient has consented to  Procedure(s): COLONOSCOPY WITH PROPOFOL (N/A) as a surgical intervention.  The patient's history has been reviewed, patient examined, no change in status, stable for surgery.  I have reviewed the patient's chart and labs.  Questions were answered to the patient's satisfaction.     Lesly Rubenstein  Ok to proceed with colonoscopy

## 2022-10-06 NOTE — H&P (Signed)
Outpatient short stay form Pre-procedure 10/06/2022  Lesly Rubenstein, MD  Primary Physician: Dion Body, MD  Reason for visit:  Change in bowel habits  History of present illness:    81 y/o lady with history of depression here for colonoscopy for change in bowel habits. Last colonoscopy in 2019 was unremarkable. No blood thinners. Father with colon cancer in his 35's.    Current Facility-Administered Medications:    0.9 %  sodium chloride infusion, , Intravenous, Continuous, Alera Quevedo, Hilton Cork, MD, Last Rate: 20 mL/hr at 10/06/22 1333, Restarted at 10/06/22 1337  Medications Prior to Admission  Medication Sig Dispense Refill Last Dose   aspirin EC 81 MG tablet Take 81 mg by mouth daily.   Past Week   calcium carbonate (OSCAL) 1500 (600 Ca) MG TABS tablet Take 1,500 mg by mouth 2 (two) times daily with a meal.   Past Week   DULoxetine (CYMBALTA) 30 MG capsule Take 30 mg by mouth daily.   10/06/2022 at 0630   magnesium gluconate (MAGONATE) 500 MG tablet Take 500 mg by mouth 2 (two) times daily.   10/05/2022   potassium chloride SA (KLOR-CON) 20 MEQ tablet Take 20 mEq by mouth 4 (four) times daily.   10/05/2022   acetaminophen (TYLENOL) 325 MG tablet Take 650 mg by mouth every 6 (six) hours as needed for moderate pain.      meclizine (ANTIVERT) 25 MG tablet Take 25 mg by mouth 3 (three) times daily as needed for dizziness.      ondansetron (ZOFRAN) 4 MG tablet Take 1 tablet (4 mg total) by mouth every 8 (eight) hours as needed for nausea or vomiting. (Patient not taking: Reported on 10/06/2022) 20 tablet 0 Completed Course     Allergies  Allergen Reactions   Advair Diskus [Fluticasone-Salmeterol]     Panic attack   Codeine Nausea And Vomiting   Prednisone     Panic attack     Past Medical History:  Diagnosis Date   Anxiety    Cancer ([place]) 09/18/2021   COPD (chronic obstructive pulmonary disease) (HCC)    GERD (gastroesophageal reflux disease)    History of hiatal  hernia 2022   OCD (obsessive compulsive disorder)    PONV (postoperative nausea and vomiting)    Nauseated after surgery 40 years ago   Pre-diabetes     Review of systems:  Otherwise negative.    Physical Exam  Gen: Alert, oriented. Appears stated age.  HEENT: PERRLA. Lungs: No respiratory distress CV: RRR Abd: soft, benign, no masses Ext: No edema    Planned procedures: Proceed with colonoscopy. The patient understands the nature of the planned procedure, indications, risks, alternatives and potential complications including but not limited to bleeding, infection, perforation, damage to internal organs and possible oversedation/side effects from anesthesia. The patient agrees and gives consent to proceed.  Please refer to procedure notes for findings, recommendations and patient disposition/instructions.     Lesly Rubenstein, MD [facility]

## 2022-10-06 NOTE — Transfer of Care (Signed)
Immediate Anesthesia Transfer of Care Note  Patient: Gail Salinas  Procedure(s) Performed: COLONOSCOPY WITH PROPOFOL  Patient Location: PACU  Anesthesia Type:General  Level of Consciousness: sedated  Airway & Oxygen Therapy: Patient Spontanous Breathing  Post-op Assessment: Report given to RN and Post -op Vital signs reviewed and stable  Post vital signs: Reviewed and stable  Last Vitals:  Vitals Value Taken Time  BP 115/51 10/06/22 1405  Temp    Pulse 76 10/06/22 1405  Resp 15 10/06/22 1405  SpO2 100 % 10/06/22 1405    Last Pain:  Vitals:   10/06/22 1302  TempSrc: Tympanic  PainSc: 0-No pain         Complications: No notable events documented.

## 2022-10-06 NOTE — Anesthesia Postprocedure Evaluation (Signed)
Anesthesia Post Note  Patient: Gail Salinas  Procedure(s) Performed: COLONOSCOPY WITH PROPOFOL  Patient location during evaluation: Endoscopy Anesthesia Type: General Level of consciousness: awake and alert Pain management: pain level controlled Vital Signs Assessment: post-procedure vital signs reviewed and stable Respiratory status: spontaneous breathing, nonlabored ventilation, respiratory function stable and patient connected to nasal cannula oxygen Cardiovascular status: blood pressure returned to baseline and stable Postop Assessment: no apparent nausea or vomiting Anesthetic complications: no   No notable events documented.   Last Vitals:  Vitals:   10/06/22 1415 10/06/22 1427  BP: (!) 111/51 131/69  Pulse: 83 72  Resp: 18 13  Temp:    SpO2: 100% 97%    Last Pain:  Vitals:   10/06/22 1427  TempSrc:   PainSc: 0-No pain                 Precious Haws Dariusz Brase

## 2022-10-06 NOTE — Op Note (Signed)
[facility] Patient Name: Gail Salinas Procedure Date: 10/06/2022 1:26 PM MRN: [id number] Account #: [id number] Date of Birth: Feb 15, 1941 Admit Type: Outpatient Age: 81 Room: [facility] ENDO ROOM 1 Gender: Female Note Status: Finalized Instrument Name: Jasper Riling [id number] Procedure:             Colonoscopy Indications:           Change in bowel habits Providers:             Andrey Farmer MD, MD Referring MD:          Dion Body (Referring MD) Medicines:             Monitored Anesthesia Care Complications:         No immediate complications. Procedure:             Pre-Anesthesia Assessment:                        - Prior to the procedure, a History and Physical was                         performed, and patient medications and allergies were                         reviewed. The patient is competent. The risks and                         benefits of the procedure and the sedation options and                         risks were discussed with the patient. All questions                         were answered and informed consent was obtained.                         Patient identification and proposed procedure were                         verified by the physician, the nurse, the                         anesthesiologist, the anesthetist and the technician                         in the endoscopy suite. Mental Status Examination:                         alert and oriented. Airway Examination: normal                         oropharyngeal airway and neck mobility. Respiratory                         Examination: clear to auscultation. CV Examination:                         normal. Prophylactic Antibiotics: The patient does not  require prophylactic antibiotics. Prior                         Anticoagulants: The patient has taken no anticoagulant                         or antiplatelet agents. ASA Grade Assessment: III - A                          patient with severe systemic disease. After reviewing                         the risks and benefits, the patient was deemed in                         satisfactory condition to undergo the procedure. The                         anesthesia plan was to use monitored anesthesia care                         (MAC). Immediately prior to administration of                         medications, the patient was re-assessed for adequacy                         to receive sedatives. The heart rate, respiratory                         rate, oxygen saturations, blood pressure, adequacy of                         pulmonary ventilation, and response to care were                         monitored throughout the procedure. The physical                         status of the patient was re-assessed after the                         procedure.                        After obtaining informed consent, the colonoscope was                         passed under direct vision. Throughout the procedure,                         the patient's blood pressure, pulse, and oxygen                         saturations were monitored continuously. The                         Colonoscope was introduced through the anus and  advanced to the the cecum, identified by appendiceal                         orifice and ileocecal valve. The colonoscopy was                         somewhat difficult due to a tortuous colon. The                         patient tolerated the procedure well. The quality of                         the bowel preparation was good. The ileocecal valve,                         appendiceal orifice, and rectum were photographed. Findings:      The perianal and digital rectal examinations were normal.      A few small-mouthed diverticula were found in the sigmoid colon.      Internal hemorrhoids were found during retroflexion. The hemorrhoids       were Grade I (internal  hemorrhoids that do not prolapse).      The exam was otherwise without abnormality on direct and retroflexion       views. Impression:            - Diverticulosis in the sigmoid colon.                        - Internal hemorrhoids.                        - The examination was otherwise normal on direct and                         retroflexion views.                        - No specimens collected. Recommendation:        - Discharge patient to home.                        - Resume previous diet.                        - Continue present medications.                        - Repeat colonoscopy is not recommended due to current                         age (79 years or older) for surveillance.                        - Return to referring physician as previously                         scheduled. Procedure Code(s):     --- Professional ---                        [phone], Colonoscopy, flexible; diagnostic, including  collection of specimen(s) by brushing or washing, when                         performed (separate procedure) Diagnosis Code(s):     --- Professional ---                        K64.0, First degree hemorrhoids                        R19.4, Change in bowel habit                        K57.30, Diverticulosis of large intestine without                         perforation or abscess without bleeding CPT copyright 2022 American [employer] Association. All rights reserved. The codes documented in this report are preliminary and upon coder review may  be revised to meet current compliance requirements. Andrey Farmer MD, MD 10/06/2022 2:05:50 PM Number of Addenda: 0 Note Initiated On: 10/06/2022 1:26 PM Scope Withdrawal Time: 0 hours 5 minutes 51 seconds  Total Procedure Duration: 0 hours 15 minutes 0 seconds  Estimated Blood Loss:  Estimated blood loss: none.      [facility]

## 2022-10-06 NOTE — Anesthesia Procedure Notes (Signed)
Date/Time: 10/06/2022 1:35 PM  Performed by: Johnna Acosta, CRNAPre-anesthesia Checklist: Patient identified, Emergency Drugs available, Suction available, Patient being monitored and Timeout performed Patient Re-evaluated:Patient Re-evaluated prior to induction Oxygen Delivery Method: Nasal cannula Preoxygenation: Pre-oxygenation with 100% oxygen Induction Type: IV induction

## 2022-10-07 ENCOUNTER — Encounter: Payer: Self-pay | Admitting: Gastroenterology

## 2022-11-25 ENCOUNTER — Ambulatory Visit (HOSPITAL_COMMUNITY): Payer: Medicare Other

## 2022-11-27 ENCOUNTER — Inpatient Hospital Stay: Payer: Medicare Other | Attending: Internal Medicine

## 2022-11-27 ENCOUNTER — Other Ambulatory Visit: Payer: Self-pay

## 2022-11-27 ENCOUNTER — Ambulatory Visit (HOSPITAL_COMMUNITY)
Admission: RE | Admit: 2022-11-27 | Discharge: 2022-11-27 | Disposition: A | Discharge status: Home / Self Care | Payer: Medicare Other | Source: Ambulatory Visit | Attending: Internal Medicine | Admitting: Internal Medicine

## 2022-11-27 DIAGNOSIS — Z79899 Other long term (current) drug therapy: Secondary | ICD-10-CM | POA: Insufficient documentation

## 2022-11-27 DIAGNOSIS — Z902 Acquired absence of lung [part of]: Secondary | ICD-10-CM | POA: Insufficient documentation

## 2022-11-27 DIAGNOSIS — Z885 Allergy status to narcotic agent status: Secondary | ICD-10-CM | POA: Insufficient documentation

## 2022-11-27 DIAGNOSIS — C3490 Malignant neoplasm of unspecified part of unspecified bronchus or lung: Secondary | ICD-10-CM

## 2022-11-27 DIAGNOSIS — Z888 Allergy status to other drugs, medicaments and biological substances status: Secondary | ICD-10-CM | POA: Insufficient documentation

## 2022-11-27 DIAGNOSIS — C3432 Malignant neoplasm of lower lobe, left bronchus or lung: Secondary | ICD-10-CM | POA: Insufficient documentation

## 2022-11-27 DIAGNOSIS — Z90710 Acquired absence of both cervix and uterus: Secondary | ICD-10-CM | POA: Insufficient documentation

## 2022-11-27 DIAGNOSIS — K219 Gastro-esophageal reflux disease without esophagitis: Secondary | ICD-10-CM | POA: Insufficient documentation

## 2022-11-27 DIAGNOSIS — Z90721 Acquired absence of ovaries, unilateral: Secondary | ICD-10-CM | POA: Insufficient documentation

## 2022-11-27 LAB — CBC WITH DIFFERENTIAL (CANCER CENTER ONLY)
Abs Immature Granulocytes: 0.01 10*3/uL (ref 0.00–0.07)
Basophils Absolute: 0.1 10*3/uL (ref 0.0–0.1)
Basophils Relative: 1 %
Eosinophils Absolute: 0.2 10*3/uL (ref 0.0–0.5)
Eosinophils Relative: 2 %
HCT: 39.9 % (ref 36.0–46.0)
Hemoglobin: 13.6 g/dL (ref 12.0–15.0)
Immature Granulocytes: 0 %
Lymphocytes Relative: 26 %
Lymphs Abs: 2 10*3/uL (ref 0.7–4.0)
MCH: 29.6 pg (ref 26.0–34.0)
MCHC: 34.1 g/dL (ref 30.0–36.0)
MCV: 86.7 fL (ref 80.0–100.0)
Monocytes Absolute: 0.7 10*3/uL (ref 0.1–1.0)
Monocytes Relative: 9 %
Neutro Abs: 4.8 10*3/uL (ref 1.7–7.7)
Neutrophils Relative %: 62 %
Platelet Count: 287 10*3/uL (ref 150–400)
RBC: 4.6 MIL/uL (ref 3.87–5.11)
RDW: 13.7 % (ref 11.5–15.5)
WBC Count: 7.7 10*3/uL (ref 4.0–10.5)
nRBC: 0 % (ref 0.0–0.2)

## 2022-11-27 LAB — CMP (CANCER CENTER ONLY)
ALT: 8 U/L (ref 0–44)
AST: 15 U/L (ref 15–41)
Albumin: 4.1 g/dL (ref 3.5–5.0)
Alkaline Phosphatase: 64 U/L (ref 38–126)
Anion gap: 6 (ref 5–15)
BUN: 21 mg/dL (ref 8–23)
CO2: 31 mmol/L (ref 22–32)
Calcium: 9.9 mg/dL (ref 8.9–10.3)
Chloride: 103 mmol/L (ref 98–111)
Creatinine: 0.9 mg/dL (ref 0.44–1.00)
GFR, Estimated: >60 mL/min (ref >60)
Glucose, Bld: 88 mg/dL (ref 70–99)
Potassium: 4 mmol/L (ref 3.5–5.1)
Sodium: 140 mmol/L (ref 135–145)
Total Bilirubin: 0.4 mg/dL (ref 0.3–1.2)
Total Protein: 7.4 g/dL (ref 6.5–8.1)

## 2022-11-27 MED ORDER — IOHEXOL 300 MG/ML  SOLN
75.0000 mL | Freq: Once | INTRAMUSCULAR | Status: AC | PRN
  Administered 2022-11-27: 75 mL via INTRAVENOUS

## 2022-11-27 MED ORDER — SODIUM CHLORIDE (PF) 0.9 % IJ SOLN
INTRAMUSCULAR | Status: AC
  Filled 2022-11-27: qty 50

## 2022-12-01 ENCOUNTER — Inpatient Hospital Stay (HOSPITAL_BASED_OUTPATIENT_CLINIC_OR_DEPARTMENT_OTHER): Payer: Medicare Other | Admitting: Internal Medicine

## 2022-12-01 VITALS — BP 141/71 | HR 82 | Temp 97.9°F | Resp 17 | Ht 65.0 in | Wt 188.7 lb

## 2022-12-01 DIAGNOSIS — Z79899 Other long term (current) drug therapy: Secondary | ICD-10-CM | POA: Diagnosis not present

## 2022-12-01 DIAGNOSIS — Z90721 Acquired absence of ovaries, unilateral: Secondary | ICD-10-CM | POA: Diagnosis not present

## 2022-12-01 DIAGNOSIS — Z90710 Acquired absence of both cervix and uterus: Secondary | ICD-10-CM | POA: Diagnosis not present

## 2022-12-01 DIAGNOSIS — Z888 Allergy status to other drugs, medicaments and biological substances status: Secondary | ICD-10-CM | POA: Diagnosis not present

## 2022-12-01 DIAGNOSIS — Z885 Allergy status to narcotic agent status: Secondary | ICD-10-CM | POA: Diagnosis not present

## 2022-12-01 DIAGNOSIS — C3490 Malignant neoplasm of unspecified part of unspecified bronchus or lung: Secondary | ICD-10-CM

## 2022-12-01 DIAGNOSIS — K219 Gastro-esophageal reflux disease without esophagitis: Secondary | ICD-10-CM | POA: Diagnosis not present

## 2022-12-01 DIAGNOSIS — Z902 Acquired absence of lung [part of]: Secondary | ICD-10-CM | POA: Diagnosis not present

## 2022-12-01 DIAGNOSIS — C3432 Malignant neoplasm of lower lobe, left bronchus or lung: Secondary | ICD-10-CM | POA: Diagnosis present

## 2022-12-01 NOTE — Progress Notes (Signed)
[place] [phone]   [phone]  PROGRESS NOTE FOR TELEMEDICINE VISITS  Dion Body, MD [address] [place] [address]  I connected withNAME@ on 12/01/22 at 11:00 AM EST by video enabled telemedicine visit and verified that I am speaking with the correct person using two identifiers.   I discussed the limitations, risks, security and privacy concerns of performing an evaluation and management service by telemedicine and the availability of in-person appointments. I also discussed with the patient that there may be a patient responsible charge related to this service. The patient expressed understanding and agreed to proceed.  Other persons participating in the visit and their role in the encounter:  None  Patient's location: [place] exam room Provider's location: [facility] office  DIAGNOSIS:  Stage IA (T1b, N0, M0) non-small cell lung cancer, adenocarcinoma diagnosed in November 2022   PRIOR THERAPY: Status post robotic assisted left lower lobe wedge resection followed by left lower lobectomy on September 18, 2021 under the care of Dr. Kipp Brood   CURRENT THERAPY: Observation.  INTERVAL HISTORY: JAQUELYN SAKAMOTO 82 y.o. female returns to the clinic today for 6 months follow-up visit.  The patient is feeling fine today with no concerning complaints.  She denied having any chest pain, shortness of breath, cough or hemoptysis.  She has no nausea, vomiting, diarrhea or constipation.  She has no headache or visual changes.  She has no recent weight loss or night sweats.  She is here today for evaluation with repeat CT scan of the chest for restaging of her disease.  MEDICAL HISTORY: Past Medical History:  Diagnosis Date   Anxiety    Cancer ([place]) 09/18/2021   COPD (chronic obstructive pulmonary disease) (HCC)    GERD (gastroesophageal reflux disease)    History of hiatal  hernia 2022   OCD (obsessive compulsive disorder)    PONV (postoperative nausea and vomiting)    Nauseated after surgery 40 years ago   Pre-diabetes     ALLERGIES:  is allergic to advair diskus [fluticasone-salmeterol], codeine, and prednisone.  MEDICATIONS:  Current Outpatient Medications  Medication Sig Dispense Refill   acetaminophen (TYLENOL) 325 MG tablet Take 650 mg by mouth every 6 (six) hours as needed for moderate pain.     aspirin EC 81 MG tablet Take 81 mg by mouth daily.     calcium carbonate (OSCAL) 1500 (600 Ca) MG TABS tablet Take 1,500 mg by mouth 2 (two) times daily with a meal.     DULoxetine (CYMBALTA) 30 MG capsule Take 30 mg by mouth daily.     magnesium gluconate (MAGONATE) 500 MG tablet Take 500 mg by mouth 2 (two) times daily.     meclizine (ANTIVERT) 25 MG tablet Take 25 mg by mouth 3 (three) times daily as needed for dizziness.     ondansetron (ZOFRAN) 4 MG tablet Take 1 tablet (4 mg total) by mouth every 8 (eight) hours as needed for nausea or vomiting. (Patient not taking: Reported on 10/06/2022) 20 tablet 0   potassium chloride SA (KLOR-CON) 20 MEQ tablet Take 20 mEq by mouth 4 (four) times daily.     No current facility-administered medications for this visit.    SURGICAL HISTORY:  Past Surgical History:  Procedure Laterality Date   ABDOMINAL HYSTERECTOMY     BREAST BIOPSY Left 2015   Negative-Fibrocystic changes   BRONCHIAL BRUSHINGS  09/18/2021   Procedure: BRONCHIAL BRUSHINGS;  Surgeon: June Leap  L, DO;  Location: [facility];  Service: Pulmonary;;   BRONCHIAL NEEDLE ASPIRATION BIOPSY  09/18/2021   Procedure: BRONCHIAL NEEDLE ASPIRATION BIOPSIES;  Surgeon: Garner Nash, DO;  Location: [facility];  Service: Pulmonary;;   COLONOSCOPY WITH PROPOFOL N/A 05/19/2018   Procedure: COLONOSCOPY WITH PROPOFOL;  Surgeon: Lollie Sails, MD;  [facility];  Service: Endoscopy;  Laterality: N/A;   COLONOSCOPY WITH PROPOFOL N/A  10/06/2022   Procedure: COLONOSCOPY WITH PROPOFOL;  Surgeon: Lesly Rubenstein, MD;  Location: [facility] ENDOSCOPY;  Service: Endoscopy;  Laterality: N/A;   ESOPHAGOGASTRODUODENOSCOPY N/A 01/28/2022   Procedure: ESOPHAGOGASTRODUODENOSCOPY (EGD);  Surgeon: Lajuana Matte, MD;  Location: [facility];  Service: Thoracic;  Laterality: N/A;   FIDUCIAL MARKER PLACEMENT  09/18/2021   Procedure: FIDUCIAL DYE MARKING;  Surgeon: Garner Nash, DO;  Location: [facility] ENDOSCOPY;  Service: Pulmonary;;   INTERCOSTAL NERVE BLOCK  09/18/2021   Procedure: INTERCOSTAL NERVE BLOCK;  Surgeon: Lajuana Matte, MD;  Location: [place] [facility];  Service: Thoracic;;   LOBECTOMY  09/18/2021   Procedure: LEFT LOWER LOBECTOMY;  Surgeon: Lajuana Matte, MD;  Location: [facility];  Service: Thoracic;;   LYMPH NODE DISSECTION  09/18/2021   Procedure: LYMPH NODE DISSECTION;  Surgeon: Lajuana Matte, MD;  Location: [facility];  Service: Thoracic;;   OOPHORECTOMY     TONSILLECTOMY     TUMOR EXCISION Left    pt states she had a 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 50+ years ago   [place] Left 09/18/2021   Procedure: VIDEO BRONCHOSCOPY WITH ENDOBRONCHIAL NAVIGATION;  Surgeon: Garner Nash, DO;  Location: [facility];  Service: Pulmonary;  Laterality: Left;  ION w/ MB:ICG Fiducial Dye Marking   XI ROBOTIC ASSISTED PARAESOPHAGEAL HERNIA REPAIR N/A 01/28/2022   Procedure: XI ROBOTIC ASSISTED PARAESOPHAGEAL HERNIA REPAIR WITH MYRIAD MESH WITH FUNDOPLICATION;  Surgeon: Lajuana Matte, MD;  Location: [facility];  Service: Thoracic;  Laterality: N/A;    REVIEW OF SYSTEMS:  A comprehensive review of systems was negative.   ECOG PERFORMANCE STATUS: 1 - Symptomatic but completely ambulatory  Blood pressure (!) 141/71, pulse 82, temperature 97.9 F (36.6 C), temperature source Temporal, resp. rate 17, height 5\' 5"  (1.651 m), weight 188 lb 11.2 oz (85.6 kg), SpO2 98 %.  LABORATORY DATA: Lab  Results  Component Value Date   WBC 7.7 11/27/2022   HGB 13.6 11/27/2022   HCT 39.9 11/27/2022   MCV 86.7 11/27/2022   PLT 287 11/27/2022      Chemistry      Component Value Date/Time   NA 140 11/27/2022 1048   NA 137 01/18/2014 0530   K 4.0 11/27/2022 1048   K 3.4 (L) 01/18/2014 0530   CL 103 11/27/2022 1048   CL 104 01/18/2014 0530   CO2 31 11/27/2022 1048   CO2 25 01/18/2014 0530   BUN 21 11/27/2022 1048   BUN 13 01/18/2014 0530   CREATININE 0.90 11/27/2022 1048   CREATININE 0.69 01/18/2014 0530      Component Value Date/Time   CALCIUM 9.9 11/27/2022 1048   CALCIUM 9.0 01/18/2014 0530   ALKPHOS 64 11/27/2022 1048   ALKPHOS 89 01/15/2014 0654   AST 15 11/27/2022 1048   ALT 8 11/27/2022 1048   ALT 14 01/15/2014 0654   BILITOT 0.4 11/27/2022 1048       RADIOGRAPHIC STUDIES: CT Chest W Contrast  Result Date: 11/29/2022 CLINICAL DATA:  Non-small-cell lung cancer. Restaging. * Tracking Code: BO *  EXAM: CT CHEST WITH CONTRAST TECHNIQUE: Multidetector CT imaging of the chest was performed during intravenous contrast administration. RADIATION DOSE REDUCTION: This exam was performed according to the departmental dose-optimization program which includes automated exposure control, adjustment of the mA and/or kV according to patient size and/or use of iterative reconstruction technique. CONTRAST:  10mL OMNIPAQUE IOHEXOL 300 MG/ML  SOLN COMPARISON:  05/25/2022 FINDINGS: Cardiovascular: The heart size is normal. No substantial pericardial effusion. Coronary artery calcification is evident. Mild atherosclerotic calcification is noted in the wall of the thoracic aorta. Mediastinum/Nodes: No mediastinal lymphadenopathy. There is no hilar lymphadenopathy. The esophagus has normal imaging features. There is no axillary lymphadenopathy. Lungs/Pleura: 2 mm right lower lobe nodule on 99/5 is stable. Status post left lower lobectomy. No suspicious pulmonary nodule or mass in the left lung. Upper  Abdomen: Sequelae of prior hiatal hernia repair with fundoplication again noted. Visualized portion of the upper abdomen is otherwise unremarkable. Musculoskeletal: No worrisome lytic or sclerotic osseous abnormality. IMPRESSION: 1. Stable exam. No new or progressive findings to suggest recurrent or metastatic disease in the chest. 2. Status post left lower lobectomy. 3. Stable 2 mm right lower lobe pulmonary nodule, likely benign etiology. Attention on follow-up recommended. 4.  Aortic Atherosclerosis (ICD10-I70.0). Electronically Signed   By: Misty Stanley M.D.   On: 11/29/2022 07:47    ASSESSMENT AND PLAN:  This is a very pleasant 82 years old white female with Stage IA (T1b, N0, M0) non-small cell lung cancer, adenocarcinoma diagnosed in November 2022.  She is status post robotic assisted left lower lobe wedge resection followed by left lower lobectomy on September 18, 2021 under the care of Dr. Kipp Brood  The patient has been in observation since that time and she is feeling fine with no concerning complaints. She had repeat CT scan of the chest performed recently.  I personally and independently reviewed the scan and discussed results with the patient today. Her scan showed no concerning findings for disease recurrence or metastasis. I recommended for the patient to continue on observation with repeat CT scan of the chest in 6 months. The patient was advised to call immediately if she has any other concerning symptoms in the interval. I discussed the assessment and treatment plan with the patient. The patient was provided an opportunity to ask questions and all were answered. The patient agreed with the plan and demonstrated an understanding of the instructions.   The patient was advised to call back or seek an in-person evaluation if the symptoms worsen or if the condition fails to improve as anticipated.  I provided 20 minutes of face-to-face video visit time during this encounter, and > 50% was  spent counseling as documented under my assessment & plan.  Eilleen Kempf, MD 12/01/2022 10:52 AM  Disclaimer: This note was dictated with voice recognition software. Similar sounding words can inadvertently be transcribed and may not be corrected upon review.

## 2023-05-23 ENCOUNTER — Emergency Department: Payer: Medicare Other

## 2023-05-23 ENCOUNTER — Other Ambulatory Visit: Payer: Self-pay

## 2023-05-23 ENCOUNTER — Emergency Department
Admission: EM | Admit: 2023-05-23 | Discharge: 2023-05-24 | Disposition: A | Discharge status: Home / Self Care | Payer: Medicare Other | Source: Home / Self Care | Attending: Emergency Medicine | Admitting: Emergency Medicine

## 2023-05-23 DIAGNOSIS — Y92008 Other place in unspecified non-institutional (private) residence as the place of occurrence of the external cause: Secondary | ICD-10-CM | POA: Insufficient documentation

## 2023-05-23 DIAGNOSIS — J449 Chronic obstructive pulmonary disease, unspecified: Secondary | ICD-10-CM | POA: Insufficient documentation

## 2023-05-23 DIAGNOSIS — Y9301 Activity, walking, marching and hiking: Secondary | ICD-10-CM | POA: Diagnosis not present

## 2023-05-23 DIAGNOSIS — S20212A Contusion of left front wall of thorax, initial encounter: Secondary | ICD-10-CM | POA: Insufficient documentation

## 2023-05-23 DIAGNOSIS — S0101XA Laceration without foreign body of scalp, initial encounter: Secondary | ICD-10-CM | POA: Insufficient documentation

## 2023-05-23 DIAGNOSIS — W01198A Fall on same level from slipping, tripping and stumbling with subsequent striking against other object, initial encounter: Secondary | ICD-10-CM | POA: Insufficient documentation

## 2023-05-23 DIAGNOSIS — Z85118 Personal history of other malignant neoplasm of bronchus and lung: Secondary | ICD-10-CM | POA: Insufficient documentation

## 2023-05-23 DIAGNOSIS — Z23 Encounter for immunization: Secondary | ICD-10-CM | POA: Diagnosis not present

## 2023-05-23 DIAGNOSIS — S0990XA Unspecified injury of head, initial encounter: Secondary | ICD-10-CM | POA: Diagnosis present

## 2023-05-23 DIAGNOSIS — W19XXXA Unspecified fall, initial encounter: Secondary | ICD-10-CM

## 2023-05-23 NOTE — ED Provider Notes (Signed)
[facility] Provider Note    Event Date/Time   First MD Initiated Contact with Patient 05/23/23 2334     (approximate)   History   Chief Complaint Fall   HPI  Gail Salinas is a 82 y.o. female with past medical history of GERD, COPD, lung cancer, and anxiety who presents to the ED following fall.  Patient reports that she was walking outside of her home when she tripped on the seam of concrete in her driveway.  She reports falling forward and striking her head as well as the left side of her chest.  She did not lose consciousness and does not take any blood thinners.  She reports some pain over the left side of her head as well as her left chest wall.  She has not had any pain in her neck, abdomen, or extremities.     Physical Exam   Triage Vital Signs: ED Triage Vitals  Encounter Vitals Group     BP 05/23/23 2011 (!) 152/75     Systolic BP Percentile --      Diastolic BP Percentile --      Pulse Rate 05/23/23 2011 92     Resp 05/23/23 2011 16     Temp 05/23/23 2011 98.3 F (36.8 C)     Temp Source 05/23/23 2011 Oral     SpO2 05/23/23 2011 98 %     Weight --      Height 05/23/23 2012 5\' 5"  (1.651 m)     Head Circumference --      Peak Flow --      Pain Score 05/23/23 2012 8     Pain Loc --      Pain Education --      Exclude from Growth Chart --     Most recent vital signs: Vitals:   05/23/23 2011 05/23/23 2339  BP: (!) 152/75 (!) 173/84  Pulse: 92 60  Resp: 16 18  Temp: 98.3 F (36.8 C) 98.2 F (36.8 C)  SpO2: 98% 99%    Constitutional: Alert and oriented. Eyes: Conjunctivae are normal. Head: Ecchymosis and mild edema to left frontal scalp with superficial laceration. Nose: No congestion/rhinnorhea. Mouth/Throat: Mucous membranes are moist.  Neck: No midline cervical spine tenderness to palpation. Cardiovascular: Normal rate, regular rhythm. Grossly normal heart sounds.  2+ radial pulses bilaterally. Respiratory: Normal  respiratory effort.  No retractions. Lungs CTAB.  Left chest wall tenderness to palpation noted. Gastrointestinal: Soft and nontender. No distention. Musculoskeletal: No lower extremity tenderness nor edema.  No upper extremity bony tenderness to palpation. Neurologic:  Normal speech and language. No gross focal neurologic deficits are appreciated.    ED Results / Procedures / Treatments   Labs (all labs ordered are listed, but only abnormal results are displayed) Labs Reviewed - No data to display  RADIOLOGY CT head reviewed and interpreted by me with no hemorrhage or midline shift.  CT cervical spine reviewed and interpreted by me with no fracture or dislocation.  PROCEDURES:  Critical Care performed: No  Procedures   MEDICATIONS ORDERED IN ED: Medications  Tdap (BOOSTRIX) injection 0.5 mL (0.5 mLs Intramuscular Given 05/24/23 0023)     IMPRESSION / MDM / ASSESSMENT AND PLAN / ED COURSE  I reviewed the triage vital signs and the nursing notes.                              81  y.o. female with past medical history of GERD, COPD, lung cancer, and anxiety who presents to the ED following trip and fall striking her left chest and face.  Patient's presentation is most consistent with acute complicated illness / injury requiring diagnostic workup.  Differential diagnosis includes, but is not limited to, intracranial injury, cervical spine injury, rib fracture, hemothorax, pneumothorax.  Patient nontoxic-appearing and in no acute distress, vital signs are unremarkable.  CT imaging of her head and neck is unremarkable, she also had a CT scan of her chest that is negative for acute traumatic injury.  No evidence of injury to her abdomen or extremities.  She does have a superficial laceration to her scalp but this does not appear to require suturing.  Her tetanus was updated and she is appropriate for discharge home with PCP follow-up.  She was counseled to return to the ED for new or  worsening symptoms.  Patient agrees with plan.      FINAL CLINICAL IMPRESSION(S) / ED DIAGNOSES   Final diagnoses:  Fall, initial encounter  Laceration of scalp, initial encounter  Contusion of left chest wall, initial encounter     Rx / DC Orders   ED Discharge Orders          Ordered    lidocaine (LIDODERM) 5 %  Every 12 hours        05/24/23 0033             Note:  This document was prepared using Dragon voice recognition software and may include unintentional dictation errors.   Chesley Noon, MD 05/24/23 [phone]

## 2023-05-23 NOTE — ED Triage Notes (Addendum)
Pt to ed from home via POV for fall. Pt ambulatory in lobby but  placed in a wheel chair. Pt advised she tripped over something at home and landed head first on the concrete. Pt is caox4, and in no acute distress. Pt denies LOC and denies thinners. Pt has small laceration to left temporal lobe area with bleeding controlled at this time.

## 2023-05-24 MED ORDER — LIDOCAINE 5 % EX PTCH: 1.0000 | MEDICATED_PATCH | Freq: Two times a day (BID) | CUTANEOUS | 0 refills | Status: AC

## 2023-05-24 MED ORDER — TETANUS-DIPHTH-ACELL PERTUSSIS 5-2.5-18.5 LF-MCG/0.5 IM SUSY
0.5000 mL | PREFILLED_SYRINGE | Freq: Once | INTRAMUSCULAR | Status: AC
  Administered 2023-05-24: 0.5 mL via INTRAMUSCULAR
  Filled 2023-05-24: qty 0.5

## 2023-06-01 ENCOUNTER — Inpatient Hospital Stay: Payer: Medicare Other | Attending: Internal Medicine

## 2023-06-01 ENCOUNTER — Ambulatory Visit (HOSPITAL_COMMUNITY)
Admission: RE | Admit: 2023-06-01 | Discharge: 2023-06-01 | Disposition: A | Discharge status: Home / Self Care | Payer: Medicare Other | Source: Ambulatory Visit | Attending: Internal Medicine | Admitting: Internal Medicine

## 2023-06-01 ENCOUNTER — Other Ambulatory Visit: Payer: Self-pay

## 2023-06-01 ENCOUNTER — Encounter (HOSPITAL_COMMUNITY): Payer: Self-pay

## 2023-06-01 DIAGNOSIS — C3490 Malignant neoplasm of unspecified part of unspecified bronchus or lung: Secondary | ICD-10-CM | POA: Insufficient documentation

## 2023-06-01 DIAGNOSIS — C3432 Malignant neoplasm of lower lobe, left bronchus or lung: Secondary | ICD-10-CM | POA: Diagnosis not present

## 2023-06-01 LAB — CBC WITH DIFFERENTIAL (CANCER CENTER ONLY)
Abs Immature Granulocytes: 0.01 10*3/uL (ref 0.00–0.07)
Basophils Absolute: 0.1 10*3/uL (ref 0.0–0.1)
Basophils Relative: 1 %
Eosinophils Absolute: 0.2 10*3/uL (ref 0.0–0.5)
Eosinophils Relative: 3 %
HCT: 40.8 % (ref 36.0–46.0)
Hemoglobin: 13.9 g/dL (ref 12.0–15.0)
Immature Granulocytes: 0 %
Lymphocytes Relative: 30 %
Lymphs Abs: 2.2 10*3/uL (ref 0.7–4.0)
MCH: 29.8 pg (ref 26.0–34.0)
MCHC: 34.1 g/dL (ref 30.0–36.0)
MCV: 87.6 fL (ref 80.0–100.0)
Monocytes Absolute: 0.6 10*3/uL (ref 0.1–1.0)
Monocytes Relative: 9 %
Neutro Abs: 4.3 10*3/uL (ref 1.7–7.7)
Neutrophils Relative %: 57 %
Platelet Count: 314 10*3/uL (ref 150–400)
RBC: 4.66 MIL/uL (ref 3.87–5.11)
RDW: 13.8 % (ref 11.5–15.5)
WBC Count: 7.4 10*3/uL (ref 4.0–10.5)
nRBC: 0 % (ref 0.0–0.2)

## 2023-06-01 LAB — CMP (CANCER CENTER ONLY)
ALT: 16 U/L (ref 0–44)
AST: 18 U/L (ref 15–41)
Albumin: 4.3 g/dL (ref 3.5–5.0)
Alkaline Phosphatase: 71 U/L (ref 38–126)
Anion gap: 10 (ref 5–15)
BUN: 20 mg/dL (ref 8–23)
CO2: 27 mmol/L (ref 22–32)
Calcium: 9.5 mg/dL (ref 8.9–10.3)
Chloride: 103 mmol/L (ref 98–111)
Creatinine: 0.98 mg/dL (ref 0.44–1.00)
GFR, Estimated: 59 mL/min — ABNORMAL LOW (ref >60)
Glucose, Bld: 114 mg/dL — ABNORMAL HIGH (ref 70–99)
Potassium: 3.6 mmol/L (ref 3.5–5.1)
Sodium: 140 mmol/L (ref 135–145)
Total Bilirubin: 0.4 mg/dL (ref 0.3–1.2)
Total Protein: 7.4 g/dL (ref 6.5–8.1)

## 2023-06-01 MED ORDER — SODIUM CHLORIDE (PF) 0.9 % IJ SOLN
INTRAMUSCULAR | Status: AC
  Filled 2023-06-01: qty 50

## 2023-06-01 MED ORDER — IOHEXOL 300 MG/ML  SOLN
75.0000 mL | Freq: Once | INTRAMUSCULAR | Status: AC | PRN
  Administered 2023-06-01: 75 mL via INTRAVENOUS

## 2023-06-03 ENCOUNTER — Inpatient Hospital Stay: Payer: Medicare Other | Attending: Internal Medicine | Admitting: Internal Medicine

## 2023-06-03 ENCOUNTER — Other Ambulatory Visit: Payer: Self-pay

## 2023-06-03 VITALS — BP 122/70 | HR 82 | Temp 97.5°F | Resp 17 | Ht 65.0 in | Wt 198.5 lb

## 2023-06-03 DIAGNOSIS — J449 Chronic obstructive pulmonary disease, unspecified: Secondary | ICD-10-CM | POA: Diagnosis not present

## 2023-06-03 DIAGNOSIS — Z79899 Other long term (current) drug therapy: Secondary | ICD-10-CM | POA: Diagnosis not present

## 2023-06-03 DIAGNOSIS — N200 Calculus of kidney: Secondary | ICD-10-CM | POA: Diagnosis not present

## 2023-06-03 DIAGNOSIS — K219 Gastro-esophageal reflux disease without esophagitis: Secondary | ICD-10-CM | POA: Diagnosis not present

## 2023-06-03 DIAGNOSIS — M542 Cervicalgia: Secondary | ICD-10-CM | POA: Insufficient documentation

## 2023-06-03 DIAGNOSIS — Z7982 Long term (current) use of aspirin: Secondary | ICD-10-CM | POA: Diagnosis not present

## 2023-06-03 DIAGNOSIS — C3490 Malignant neoplasm of unspecified part of unspecified bronchus or lung: Secondary | ICD-10-CM | POA: Diagnosis not present

## 2023-06-03 DIAGNOSIS — C3432 Malignant neoplasm of lower lobe, left bronchus or lung: Secondary | ICD-10-CM | POA: Diagnosis present

## 2023-06-03 DIAGNOSIS — I700 Atherosclerosis of aorta: Secondary | ICD-10-CM | POA: Insufficient documentation

## 2023-06-03 NOTE — Progress Notes (Signed)
[facility] [phone]   [phone]  OFFICE PROGRESS NOTE  Gail Ivan, MD [address] [facility] [place] [address]  DIAGNOSIS:  Stage IA (T1b, N0, M0) non-small cell lung cancer, adenocarcinoma diagnosed in November 2022  PRIOR THERAPY: Status post robotic assisted left lower lobe wedge resection followed by left lower lobectomy on September 18, 2021 under the care of Dr. Cliffton Asters  CURRENT THERAPY: Observation.  INTERVAL HISTORY: Gail Salinas 82 y.o. female returns to the clinic today for annual follow-up visit.  The patient is feeling fine today with no concerning complaints.  She denied having any current chest pain, shortness of breath, cough or hemoptysis.  She has no nausea, vomiting, diarrhea or constipation.  She has no headache or visual changes.  She denied having any significant weight loss or night sweats.  She has a fall recently and she was seen at the emergency department and had several imaging studies including CT scan of the chest without contrast that showed no concerning findings.  For some reason she had repeat CT scan of the chest with contrast a week later and the final report is still pending.  She is here today for evaluation and discussion of her imaging studies.   MEDICAL HISTORY: Past Medical History:  Diagnosis Date   Anxiety    Cancer (HCC) 09/18/2021   COPD (chronic obstructive pulmonary disease) (HCC)    GERD (gastroesophageal reflux disease)    History of hiatal hernia 2022   OCD (obsessive compulsive disorder)    PONV (postoperative nausea and vomiting)    Nauseated after surgery 40 years ago   Pre-diabetes     ALLERGIES:  is allergic to advair diskus [fluticasone-salmeterol], codeine, and prednisone.  MEDICATIONS:  Current Outpatient Medications  Medication Sig Dispense Refill   acetaminophen (TYLENOL) 325 MG tablet Take 650 mg by mouth every 6 (six) hours as needed for  moderate pain.     aspirin EC 81 MG tablet Take 81 mg by mouth daily.     calcium carbonate (OSCAL) 1500 (600 Ca) MG TABS tablet Take 1,500 mg by mouth 2 (two) times daily with a meal.     DULoxetine (CYMBALTA) 30 MG capsule Take 30 mg by mouth daily.     lidocaine (LIDODERM) 5 % Place 1 patch onto the skin every 12 (twelve) hours. Remove & Discard patch within 12 hours or as directed by MD 10 patch 0   magnesium gluconate (MAGONATE) 500 MG tablet Take 500 mg by mouth 2 (two) times daily.     meclizine (ANTIVERT) 25 MG tablet Take 25 mg by mouth 3 (three) times daily as needed for dizziness.     ondansetron (ZOFRAN) 4 MG tablet Take 1 tablet (4 mg total) by mouth every 8 (eight) hours as needed for nausea or vomiting. (Patient not taking: Reported on 10/06/2022) 20 tablet 0   potassium chloride SA (KLOR-CON) 20 MEQ tablet Take 20 mEq by mouth 4 (four) times daily.     No current facility-administered medications for this visit.    SURGICAL HISTORY:  Past Surgical History:  Procedure Laterality Date   ABDOMINAL HYSTERECTOMY     BREAST BIOPSY Left 2015   Negative-Fibrocystic changes   BRONCHIAL BRUSHINGS  09/18/2021   Procedure: BRONCHIAL BRUSHINGS;  Surgeon: Josephine Igo, DO;  Location: [facility] ENDOSCOPY;  Service: Pulmonary;;   BRONCHIAL NEEDLE ASPIRATION BIOPSY  09/18/2021   Procedure: BRONCHIAL NEEDLE ASPIRATION BIOPSIES;  Surgeon: Audie Box  L, DO;  Location: [facility] ENDOSCOPY;  Service: Pulmonary;;   COLONOSCOPY WITH PROPOFOL N/A 05/19/2018   Procedure: COLONOSCOPY WITH PROPOFOL;  Surgeon: Christena Deem, MD;  Location: [facility] ENDOSCOPY;  Service: Endoscopy;  Laterality: N/A;   COLONOSCOPY WITH PROPOFOL N/A 10/06/2022   Procedure: COLONOSCOPY WITH PROPOFOL;  Surgeon: Regis Bill, MD;  Location: [facility] ENDOSCOPY;  Service: Endoscopy;  Laterality: N/A;   ESOPHAGOGASTRODUODENOSCOPY N/A 01/28/2022   Procedure: ESOPHAGOGASTRODUODENOSCOPY (EGD);  Surgeon: Corliss Skains, MD;   Location: [facility] OR;  Service: Thoracic;  Laterality: N/A;   FIDUCIAL MARKER PLACEMENT  09/18/2021   Procedure: FIDUCIAL DYE MARKING;  Surgeon: Josephine Igo, DO;  Location: [facility] ENDOSCOPY;  Service: Pulmonary;;   INTERCOSTAL NERVE BLOCK  09/18/2021   Procedure: INTERCOSTAL NERVE BLOCK;  Surgeon: Corliss Skains, MD;  Location: [facility] OR;  Service: Thoracic;;   LOBECTOMY  09/18/2021   Procedure: LEFT LOWER LOBECTOMY;  Surgeon: Corliss Skains, MD;  Location: [facility] OR;  Service: Thoracic;;   LYMPH NODE DISSECTION  09/18/2021   Procedure: LYMPH NODE DISSECTION;  Surgeon: Corliss Skains, MD;  Location: [facility] OR;  Service: Thoracic;;   OOPHORECTOMY     TONSILLECTOMY     TUMOR EXCISION Left    pt states she had a 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 50+ years ago   VIDEO BRONCHOSCOPY WITH ENDOBRONCHIAL NAVIGATION Left 09/18/2021   Procedure: VIDEO BRONCHOSCOPY WITH ENDOBRONCHIAL NAVIGATION;  Surgeon: Josephine Igo, DO;  Location: [facility] ENDOSCOPY;  Service: Pulmonary;  Laterality: Left;  ION w/ MB:ICG Fiducial Dye Marking   XI ROBOTIC ASSISTED PARAESOPHAGEAL HERNIA REPAIR N/A 01/28/2022   Procedure: XI ROBOTIC ASSISTED PARAESOPHAGEAL HERNIA REPAIR WITH MYRIAD MESH WITH FUNDOPLICATION;  Surgeon: Corliss Skains, MD;  Location: [facility] OR;  Service: Thoracic;  Laterality: N/A;    REVIEW OF SYSTEMS:  A comprehensive review of systems was negative except for: Constitutional: positive for fatigue   PHYSICAL EXAMINATION: General appearance: alert, cooperative, fatigued, and no distress Head: Normocephalic, without obvious abnormality, atraumatic Neck: no adenopathy, no JVD, supple, symmetrical, trachea midline, and thyroid not enlarged, symmetric, no tenderness/mass/nodules Lymph nodes: Cervical, supraclavicular, and axillary nodes normal. Resp: clear to auscultation bilaterally Back: symmetric, no curvature. ROM normal. No CVA tenderness. Cardio: regular rate and rhythm, S1, S2 normal, no murmur,  click, rub or gallop GI: soft, non-tender; bowel sounds normal; no masses,  no organomegaly Extremities: extremities normal, atraumatic, no cyanosis or edema  ECOG PERFORMANCE STATUS: 1 - Symptomatic but completely ambulatory  Blood pressure 122/70, pulse 82, temperature (!) 97.5 F (36.4 C), temperature source Oral, resp. rate 17, height 5\' 5"  (1.651 m), weight 198 lb 8 oz (90 kg), SpO2 98%.  LABORATORY DATA: Lab Results  Component Value Date   WBC 7.4 06/01/2023   HGB 13.9 06/01/2023   HCT 40.8 06/01/2023   MCV 87.6 06/01/2023   PLT 314 06/01/2023      Chemistry      Component Value Date/Time   NA 140 06/01/2023 0903   NA 137 01/18/2014 0530   K 3.6 06/01/2023 0903   K 3.4 (L) 01/18/2014 0530   CL 103 06/01/2023 0903   CL 104 01/18/2014 0530   CO2 27 06/01/2023 0903   CO2 25 01/18/2014 0530   BUN 20 06/01/2023 0903   BUN 13 01/18/2014 0530   CREATININE 0.98 06/01/2023 0903   CREATININE 0.69 01/18/2014 0530      Component Value Date/Time   CALCIUM 9.5 06/01/2023 0903   CALCIUM 9.0  01/18/2014 0530   ALKPHOS 71 06/01/2023 0903   ALKPHOS 89 01/15/2014 0654   AST 18 06/01/2023 0903   ALT 16 06/01/2023 0903   ALT 14 01/15/2014 0654   BILITOT 0.4 06/01/2023 0903       RADIOGRAPHIC STUDIES: CT CHEST WO CONTRAST  Result Date: 05/23/2023 CLINICAL DATA:  Fall from standing with chest pain. EXAM: CT CHEST WITHOUT CONTRAST TECHNIQUE: Multidetector CT imaging of the chest was performed following the standard protocol without IV contrast. RADIATION DOSE REDUCTION: This exam was performed according to the departmental dose-optimization program which includes automated exposure control, adjustment of the mA and/or kV according to patient size and/or use of iterative reconstruction technique. COMPARISON:  Chest CT dated 11/27/2022. FINDINGS: Cardiovascular: Vascular calcifications are seen in the coronary arteries and aortic arch. Normal heart size. No pericardial effusion.  Mediastinum/Nodes: No enlarged mediastinal or axillary lymph nodes. Thyroid gland, trachea, and esophagus demonstrate no significant findings. Lungs/Pleura: The patient is status post a left lower lobectomy. A 2 mm solid pulmonary nodule in the right lower lobe is unchanged. No further imaging follow-up is recommended for this finding. There is minimal bibasilar atelectasis. No pleural effusion or pneumothorax. Upper Abdomen: The patient is status post hernia repair and fundoplication. There is a 3 mm nonobstructive left renal calculus. Musculoskeletal: No chest wall mass or suspicious bone lesions identified. IMPRESSION: 1. No acute findings in the chest. 2. Nonobstructive left renal calculus. Aortic Atherosclerosis (ICD10-I70.0). Electronically Signed   By: Romona Curls M.D.   On: 05/23/2023 20:59   CT Head Wo Contrast  Result Date: 05/23/2023 CLINICAL DATA:  Fall from standing with head and neck pain. EXAM: CT HEAD WITHOUT CONTRAST CT CERVICAL SPINE WITHOUT CONTRAST TECHNIQUE: Multidetector CT imaging of the head and cervical spine was performed following the standard protocol without intravenous contrast. Multiplanar CT image reconstructions of the cervical spine were also generated. RADIATION DOSE REDUCTION: This exam was performed according to the departmental dose-optimization program which includes automated exposure control, adjustment of the mA and/or kV according to patient size and/or use of iterative reconstruction technique. COMPARISON:  None Available. FINDINGS: CT HEAD FINDINGS Brain: No evidence of acute infarction, hemorrhage, hydrocephalus, extra-axial collection or mass lesion/mass effect. There is mild cerebral volume loss with associated ex vacuo dilatation. Periventricular white matter hypoattenuation likely represents chronic small vessel ischemic disease. Vascular: There are vascular calcifications in the carotid siphons. Skull: Normal. Negative for fracture or focal lesion.  Sinuses/Orbits: There is left ethmoid sinus disease. Other: There is soft tissue swelling of the left face. CT CERVICAL SPINE FINDINGS Alignment: Normal. Skull base and vertebrae: No acute fracture. No primary bone lesion or focal pathologic process. Soft tissues and spinal canal: No prevertebral fluid or swelling. No visible canal hematoma. 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 Upper chest: Negative. Other: None. IMPRESSION: 1. No acute intracranial process. 2. No acute osseous injury in the cervical spine. Electronically Signed   By: Romona Curls M.D.   On: 05/23/2023 20:53   CT Cervical Spine Wo Contrast  Result Date: 05/23/2023 CLINICAL DATA:  Fall from standing with head and neck pain. EXAM: CT HEAD WITHOUT CONTRAST CT CERVICAL SPINE WITHOUT CONTRAST TECHNIQUE: Multidetector CT imaging of the head and cervical spine was performed following the standard protocol without intravenous contrast. Multiplanar CT image reconstructions of the cervical spine were also generated. RADIATION DOSE REDUCTION: This exam was performed according to the departmental dose-optimization program which includes automated exposure control, adjustment of the  mA and/or kV according to patient size and/or use of iterative reconstruction technique. COMPARISON:  None Available. FINDINGS: CT HEAD FINDINGS Brain: No evidence of acute infarction, hemorrhage, hydrocephalus, extra-axial collection or mass lesion/mass effect. There is mild cerebral volume loss with associated ex vacuo dilatation. Periventricular white matter hypoattenuation likely represents chronic small vessel ischemic disease. Vascular: There are vascular calcifications in the carotid siphons. Skull: Normal. Negative for fracture or focal lesion. Sinuses/Orbits: There is left ethmoid sinus disease. Other: There is soft tissue swelling of the left face. CT CERVICAL SPINE FINDINGS Alignment: Normal. Skull base and vertebrae: No acute  fracture. No primary bone lesion or focal pathologic process. Soft tissues and spinal canal: No prevertebral fluid or swelling. No visible canal hematoma. 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 Upper chest: Negative. Other: None. IMPRESSION: 1. No acute intracranial process. 2. No acute osseous injury in the cervical spine. Electronically Signed   By: Romona Curls M.D.   On: 05/23/2023 20:53    ASSESSMENT AND PLAN: This is a very pleasant 82 years old white female with Stage IA (T1b, N0, M0) non-small cell lung cancer, adenocarcinoma diagnosed in November 2022.  She is status post robotic assisted left lower lobe wedge resection followed by left lower lobectomy on September 18, 2021 under the care of Dr. Cliffton Asters The patient has been in observation since that time and she is feeling fine. She had repeat CT scan of the chest less than 10 days ago without contrast and it was negative for any disease recurrence.  For some reason she had scan of the chest with contrast a week later but this report is still pending. I assured the patient that her PET scan showed no concerning findings for disease recurrence or metastasis and its likely to be the case with the second scan but if there is any concerning abnormalities I will call her. I recommended for her to continue on observation with repeat CT scan of the chest in 1 year. She was advised to call immediately if she has any other concerning symptoms in the interval. The patient voices understanding of current disease status and treatment options and is in agreement with the current care plan.  All questions were answered. The patient knows to call the clinic with any problems, questions or concerns. We can certainly see the patient much sooner if necessary.  The total time spent in the appointment was 20 minutes.  Disclaimer: This note was dictated with voice recognition software. Similar sounding words can inadvertently  be transcribed and may not be corrected upon review.

## 2023-06-06 IMAGING — RF DG ESOPHAGUS
8 of 11 series · 17 of 24 positions shown · IV contrast (omnipaque)
Comparison: NONE.

CLINICAL DATA: 72-year-old female status post periesophageal hernia
repair yesterday. Request for water-soluble esophagram to rule out
leak.

EXAM:
ESOPHAGUS/BARIUM SWALLOW/TABLET STUDY
TECHNIQUE: Single contrast examination was performed using Omnipaque 300. This
exam was performed by Edmar Deer, and was supervised and
interpreted by Lmokhtar Tablet, CELESTINO.
FLUOROSCOPY:
Radiation Exposure Index (as provided by the fluoroscopic device): 2
minute 12 seconds 18.6 mGy Kerma

[Series 1: cp_standard · 0.25mm/px · 1 of 1 slices shown (1 of 8)]
[im 1/1]
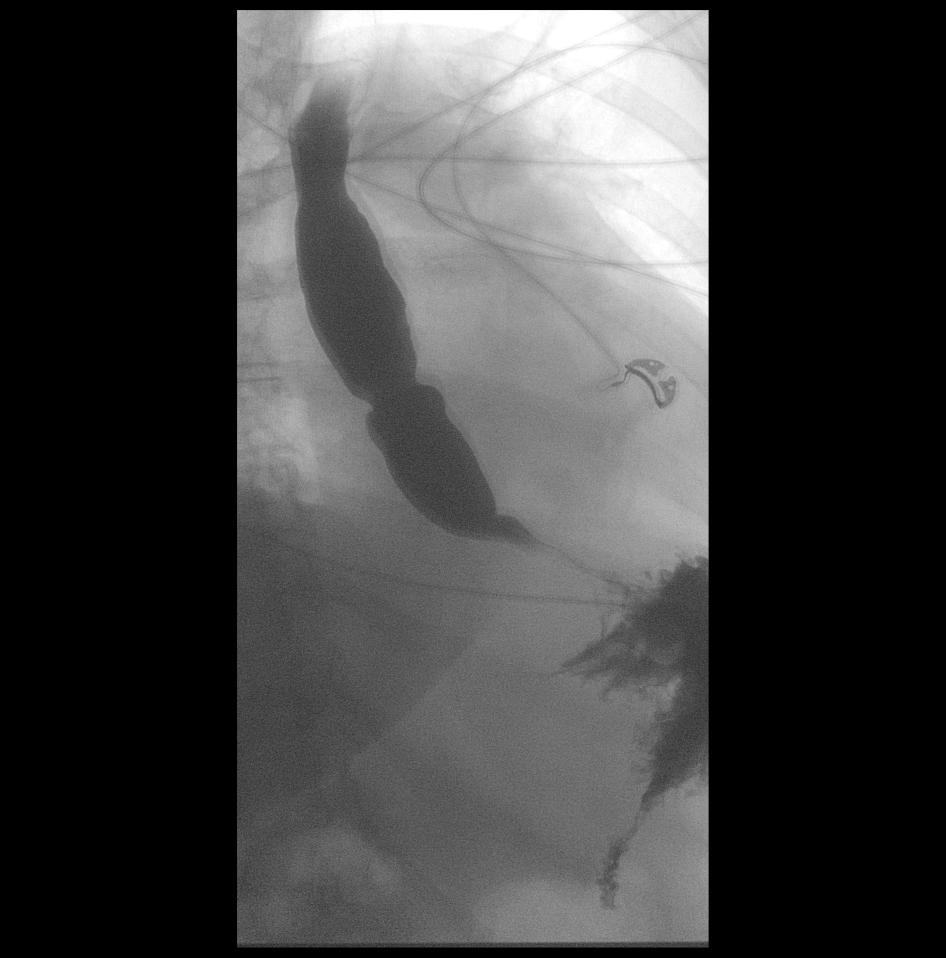

[Series 3: cp_standard · 0.34mm/px · 3 of 30 frames shown (2 of 8)]
[frame 5/30]
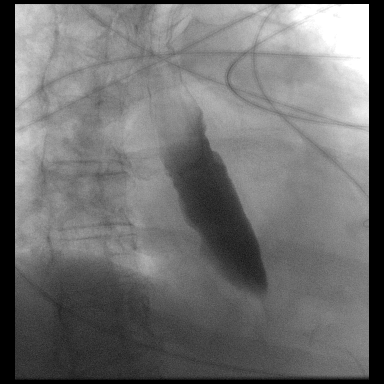
[frame 22/30]
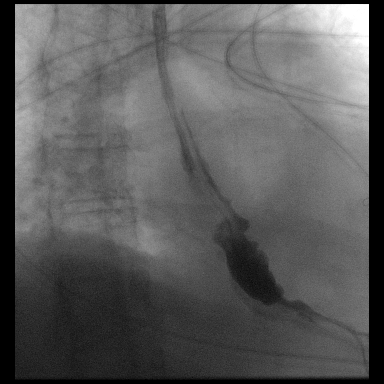
[frame 26/30]
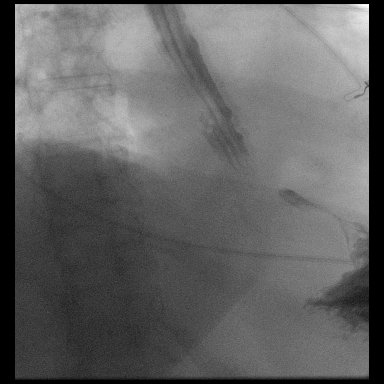

[Series 4: cp_standard · 0.34mm/px · 2 of 34 frames shown (3 of 8)]
[frame 18/34]
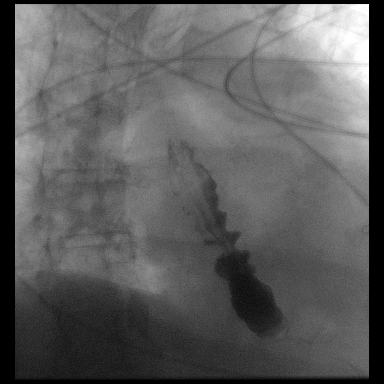
[frame 29/34]
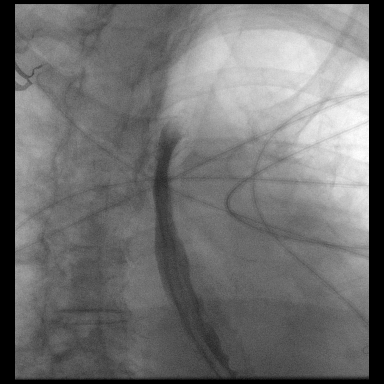

[Series 6: cp_standard · 0.51mm/px · 2 of 33 frames shown (4 of 8)]
[frame 4/33]
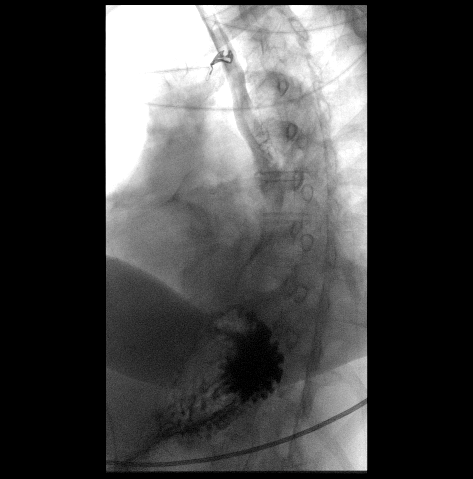
[frame 5/33]
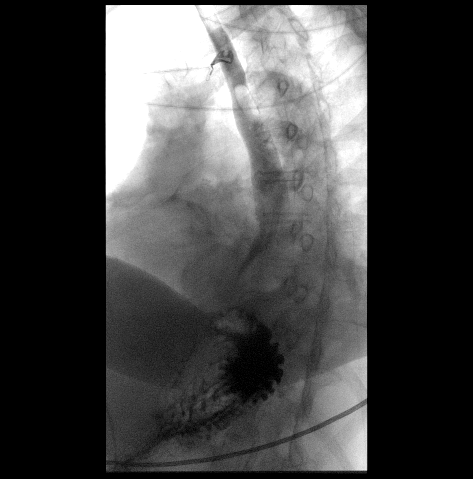

[Series 7: cp_standard · 0.34mm/px · 3 of 61 frames shown (5 of 8)]
[frame 10/61]
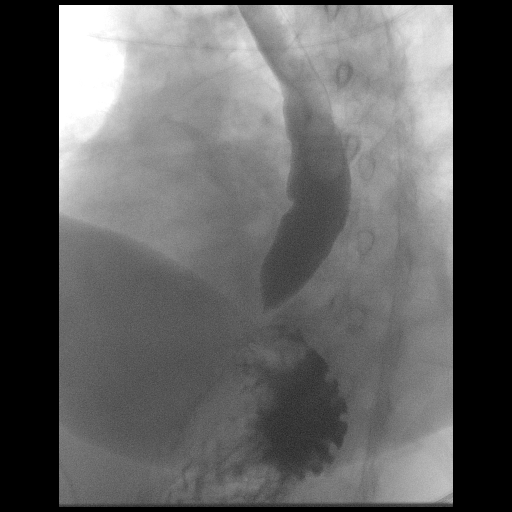
[frame 31/61]
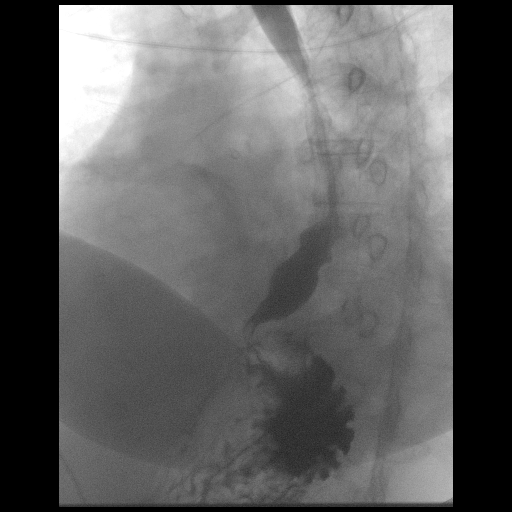
[frame 52/61]
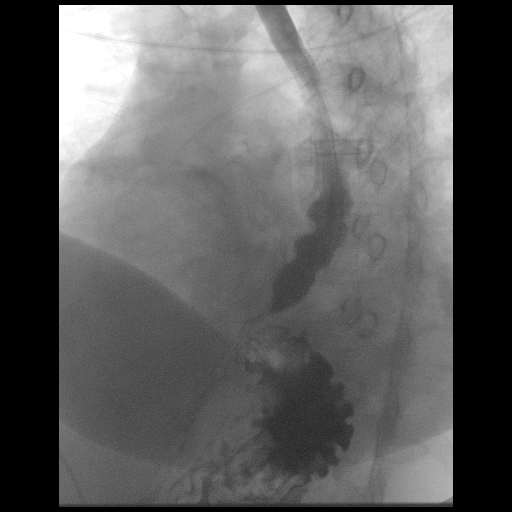

[Series 8: cp_standard · 0.51mm/px · 2 of 31 frames shown (6 of 8)]
[frame 5/31]
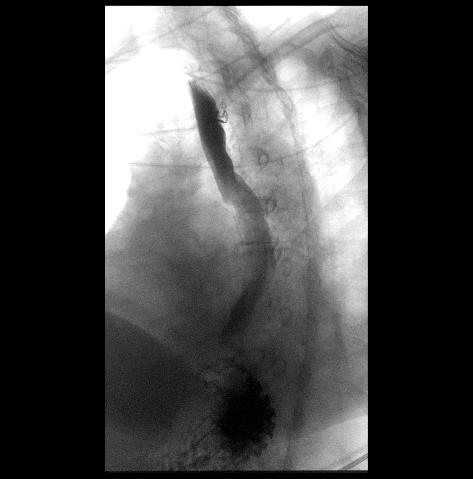
[frame 19/31]
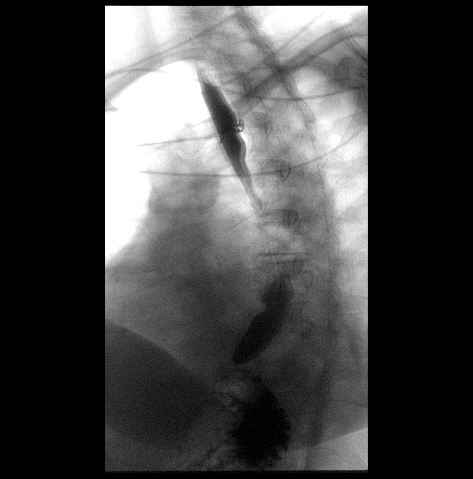

[Series 9: cp_standard · 0.34mm/px · 3 of 33 frames shown (7 of 8)]
[frame 5/33]
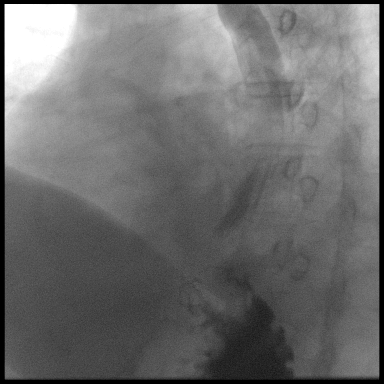
[frame 13/33]
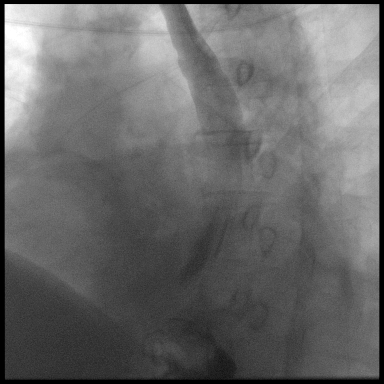
[frame 29/33]
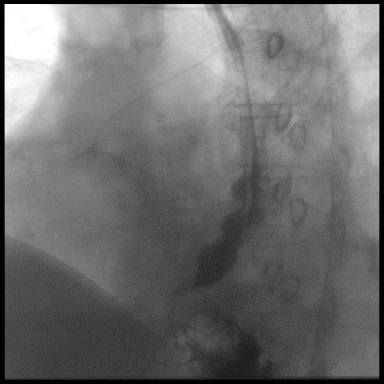

[Series 11: cp_standard · 0.17mm/px · 1 of 1 slices shown (8 of 8)]
[im 1/1]
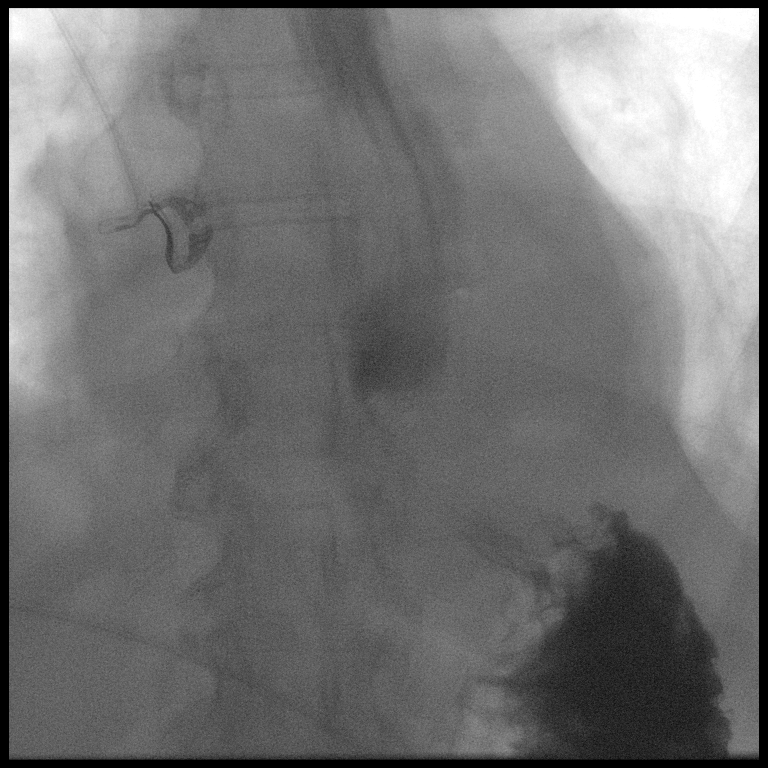

[17 of 24 positions shown; findings below may reference images not displayed]

FINDINGS: Esophagus: Dilated.

Esophageal motility: Decreased, likely due to presbyesophagus.

Hiatal Hernia: Status post paraesophageal hiatal hernia repair on
01/28/2022. No leak around the surgical site noted. Narrowing in the
region of the distal esophagus is likely due to postoperative edema.

Gastroesophageal reflux: Moderate gastroesophageal reflux.

Other: None.
IMPRESSION: No leak around the surgical site noted.

Exam performed by Edmar Deer.

## 2023-06-07 ENCOUNTER — Telehealth: Payer: Self-pay | Admitting: Pulmonary Disease

## 2023-06-07 NOTE — Telephone Encounter (Signed)
Pt husband returning call from recall. Pt is no longer wanting to be seen here .

## 2023-07-14 ENCOUNTER — Other Ambulatory Visit: Payer: Self-pay | Admitting: Family Medicine

## 2023-07-14 DIAGNOSIS — Z1231 Encounter for screening mammogram for malignant neoplasm of breast: Secondary | ICD-10-CM

## 2023-08-05 ENCOUNTER — Ambulatory Visit
Admission: RE | Admit: 2023-08-05 | Discharge: 2023-08-05 | Disposition: A | Discharge status: Home / Self Care | Payer: Medicare Other | Source: Ambulatory Visit | Attending: Family Medicine | Admitting: Family Medicine

## 2023-08-05 DIAGNOSIS — Z1231 Encounter for screening mammogram for malignant neoplasm of breast: Secondary | ICD-10-CM | POA: Diagnosis present

## 2023-10-12 ENCOUNTER — Other Ambulatory Visit: Payer: Self-pay | Admitting: Family Medicine

## 2023-10-12 DIAGNOSIS — G459 Transient cerebral ischemic attack, unspecified: Secondary | ICD-10-CM

## 2023-10-14 ENCOUNTER — Ambulatory Visit
Admission: RE | Admit: 2023-10-14 | Discharge: 2023-10-14 | Disposition: A | Discharge status: Home / Self Care | Payer: Medicare Other | Source: Ambulatory Visit | Attending: Family Medicine | Admitting: Family Medicine

## 2023-10-14 DIAGNOSIS — G459 Transient cerebral ischemic attack, unspecified: Secondary | ICD-10-CM | POA: Insufficient documentation

## 2023-10-14 MED ORDER — GADOBUTROL 1 MMOL/ML IV SOLN
9.0000 mL | Freq: Once | INTRAVENOUS | Status: AC | PRN
  Administered 2023-10-14: 9 mL via INTRAVENOUS

## 2024-05-25 ENCOUNTER — Inpatient Hospital Stay: Attending: Internal Medicine

## 2024-05-25 ENCOUNTER — Other Ambulatory Visit: Payer: Medicare Other

## 2024-05-25 ENCOUNTER — Ambulatory Visit (HOSPITAL_COMMUNITY)

## 2024-05-25 ENCOUNTER — Ambulatory Visit (HOSPITAL_COMMUNITY)
Admission: RE | Admit: 2024-05-25 | Discharge: 2024-05-25 | Disposition: A | Discharge status: Home / Self Care | Source: Ambulatory Visit | Attending: Internal Medicine | Admitting: Internal Medicine

## 2024-05-25 DIAGNOSIS — Z79899 Other long term (current) drug therapy: Secondary | ICD-10-CM | POA: Insufficient documentation

## 2024-05-25 DIAGNOSIS — C3432 Malignant neoplasm of lower lobe, left bronchus or lung: Secondary | ICD-10-CM | POA: Insufficient documentation

## 2024-05-25 DIAGNOSIS — C3490 Malignant neoplasm of unspecified part of unspecified bronchus or lung: Secondary | ICD-10-CM | POA: Insufficient documentation

## 2024-05-25 DIAGNOSIS — Z7982 Long term (current) use of aspirin: Secondary | ICD-10-CM | POA: Diagnosis not present

## 2024-05-25 DIAGNOSIS — I700 Atherosclerosis of aorta: Secondary | ICD-10-CM | POA: Insufficient documentation

## 2024-05-25 DIAGNOSIS — Z8673 Personal history of transient ischemic attack (TIA), and cerebral infarction without residual deficits: Secondary | ICD-10-CM | POA: Insufficient documentation

## 2024-05-25 DIAGNOSIS — K219 Gastro-esophageal reflux disease without esophagitis: Secondary | ICD-10-CM | POA: Diagnosis not present

## 2024-05-25 DIAGNOSIS — K449 Diaphragmatic hernia without obstruction or gangrene: Secondary | ICD-10-CM | POA: Insufficient documentation

## 2024-05-25 DIAGNOSIS — J449 Chronic obstructive pulmonary disease, unspecified: Secondary | ICD-10-CM | POA: Insufficient documentation

## 2024-05-25 DIAGNOSIS — I2510 Atherosclerotic heart disease of native coronary artery without angina pectoris: Secondary | ICD-10-CM | POA: Diagnosis not present

## 2024-05-25 LAB — CMP (CANCER CENTER ONLY)
ALT: 10 U/L (ref 0–44)
AST: 13 U/L — ABNORMAL LOW (ref 15–41)
Albumin: 4.3 g/dL (ref 3.5–5.0)
Alkaline Phosphatase: 65 U/L (ref 38–126)
Anion gap: 6 (ref 5–15)
BUN: 18 mg/dL (ref 8–23)
CO2: 31 mmol/L (ref 22–32)
Calcium: 10 mg/dL (ref 8.9–10.3)
Chloride: 103 mmol/L (ref 98–111)
Creatinine: 1.04 mg/dL — ABNORMAL HIGH (ref 0.44–1.00)
GFR, Estimated: 54 mL/min — ABNORMAL LOW (ref >60)
Glucose, Bld: 89 mg/dL (ref 70–99)
Potassium: 4.7 mmol/L (ref 3.5–5.1)
Sodium: 140 mmol/L (ref 135–145)
Total Bilirubin: 0.4 mg/dL (ref 0.0–1.2)
Total Protein: 7.5 g/dL (ref 6.5–8.1)

## 2024-05-25 LAB — CBC WITH DIFFERENTIAL (CANCER CENTER ONLY)
Abs Immature Granulocytes: 0.02 K/uL (ref 0.00–0.07)
Basophils Absolute: 0.1 K/uL (ref 0.0–0.1)
Basophils Relative: 1 %
Eosinophils Absolute: 0.2 K/uL (ref 0.0–0.5)
Eosinophils Relative: 2 %
HCT: 40 % (ref 36.0–46.0)
Hemoglobin: 13.6 g/dL (ref 12.0–15.0)
Immature Granulocytes: 0 %
Lymphocytes Relative: 24 %
Lymphs Abs: 2.2 K/uL (ref 0.7–4.0)
MCH: 30 pg (ref 26.0–34.0)
MCHC: 34 g/dL (ref 30.0–36.0)
MCV: 88.3 fL (ref 80.0–100.0)
Monocytes Absolute: 1 K/uL (ref 0.1–1.0)
Monocytes Relative: 10 %
Neutro Abs: 5.9 K/uL (ref 1.7–7.7)
Neutrophils Relative %: 63 %
Platelet Count: 304 K/uL (ref 150–400)
RBC: 4.53 MIL/uL (ref 3.87–5.11)
RDW: 13.7 % (ref 11.5–15.5)
WBC Count: 9.3 K/uL (ref 4.0–10.5)
nRBC: 0 % (ref 0.0–0.2)

## 2024-05-30 ENCOUNTER — Inpatient Hospital Stay (HOSPITAL_BASED_OUTPATIENT_CLINIC_OR_DEPARTMENT_OTHER): Payer: Medicare Other | Admitting: Internal Medicine

## 2024-05-30 VITALS — BP 128/73 | HR 83 | Temp 97.3°F | Resp 16 | Ht 65.0 in | Wt 185.0 lb

## 2024-05-30 DIAGNOSIS — C3432 Malignant neoplasm of lower lobe, left bronchus or lung: Secondary | ICD-10-CM | POA: Diagnosis not present

## 2024-05-30 NOTE — Progress Notes (Signed)
 [facility] [phone]   [phone]  OFFICE PROGRESS NOTE  Alla Amis, MD [address] [facility] [place] [address]  DIAGNOSIS:  Stage IA (T1b, N0, M0) non-small cell lung cancer, adenocarcinoma diagnosed in November 2022  PRIOR THERAPY: Status post robotic assisted left lower lobe wedge resection followed by left lower lobectomy on September 18, 2021 under the care of Dr. Shyrl  CURRENT THERAPY: Observation.  INTERVAL HISTORY: Gail Salinas 83 y.o. female returns to the clinic today for annual follow-up visit.Discussed the use of AI scribe software for clinical note transcription with the patient, who gave verbal consent to proceed.  History of Present Illness Gail Salinas is an 83 year old female with stage one adenocarcinoma of the lung who presents for evaluation with repeat CT scan for restaging of her disease.  She was diagnosed with stage one adenocarcinoma of the lung in November 2022 and underwent a left lower lobe resection followed by a left lower lobectomy. She has been under observation since then.  She takes Plavix once daily, prescribed after experiencing transient ischemic attacks. She initially could not recall the name of the medication but later identified it as Plavix, a platelet inhibitor. She no longer takes baby aspirin .  Her rotator cuff and knee are severely affected, leading to the use of a cane due to knee pain. Despite these issues, she feels fine otherwise.  Her husband was recently diagnosed with lung cancer and is receiving treatment at the [facility]. She inquires about transferring her checkups to the same center for convenience as she lives nearby.     MEDICAL HISTORY: Past Medical History:  Diagnosis Date   Anxiety    Cancer (HCC) 09/18/2021   COPD (chronic obstructive pulmonary disease) (HCC)    GERD (gastroesophageal reflux disease)    History of  hiatal hernia 2022   OCD (obsessive compulsive disorder)    PONV (postoperative nausea and vomiting)    Nauseated after surgery 40 years ago   Pre-diabetes     ALLERGIES:  is allergic to advair diskus [fluticasone-salmeterol], codeine, and prednisone.  MEDICATIONS:  Current Outpatient Medications  Medication Sig Dispense Refill   acetaminophen  (TYLENOL ) 325 MG tablet Take 650 mg by mouth every 6 (six) hours as needed for moderate pain.     aspirin  EC 81 MG tablet Take 81 mg by mouth daily.     calcium carbonate (OSCAL) 1500 (600 Ca) MG TABS tablet Take 1,500 mg by mouth 2 (two) times daily with a meal.     DULoxetine  (CYMBALTA ) 30 MG capsule Take 30 mg by mouth daily.     magnesium gluconate (MAGONATE) 500 MG tablet Take 500 mg by mouth 2 (two) times daily.     meclizine (ANTIVERT) 25 MG tablet Take 25 mg by mouth 3 (three) times daily as needed for dizziness.     ondansetron  (ZOFRAN ) 4 MG tablet Take 1 tablet (4 mg total) by mouth every 8 (eight) hours as needed for nausea or vomiting. (Patient not taking: Reported on 10/06/2022) 20 tablet 0   potassium chloride  SA (KLOR-CON ) 20 MEQ tablet Take 20 mEq by mouth 4 (four) times daily.     No current facility-administered medications for this visit.    SURGICAL HISTORY:  Past Surgical History:  Procedure Laterality Date   ABDOMINAL HYSTERECTOMY     BREAST BIOPSY Left 2015   Negative-Fibrocystic changes   BRONCHIAL BRUSHINGS  09/18/2021   Procedure:  BRONCHIAL BRUSHINGS;  Surgeon: Brenna Adine CROME, DO;  [facility]: [facility];  Service: Pulmonary;;   BRONCHIAL NEEDLE ASPIRATION BIOPSY  09/18/2021   Procedure: BRONCHIAL NEEDLE ASPIRATION BIOPSIES;  Surgeon: Brenna Adine CROME, DO;  Location: [facility];  Service: Pulmonary;;   COLONOSCOPY WITH PROPOFOL  N/A 05/19/2018   Procedure: COLONOSCOPY WITH PROPOFOL ;  Surgeon: Gaylyn Gladis PENNER, MD;  Location: [facility] ENDOSCOPY;  Service: Endoscopy;  Laterality: N/A;   COLONOSCOPY WITH PROPOFOL  N/A  10/06/2022   Procedure: COLONOSCOPY WITH PROPOFOL ;  Surgeon: Maryruth Ole DASEN, MD;  Location: [facility] ENDOSCOPY;  Service: Endoscopy;  Laterality: N/A;   ESOPHAGOGASTRODUODENOSCOPY N/A 01/28/2022   Procedure: ESOPHAGOGASTRODUODENOSCOPY (EGD);  Surgeon: Shyrl Linnie KIDD, MD;  Location: [facility] OR;  Service: Thoracic;  Laterality: N/A;   FIDUCIAL MARKER PLACEMENT  09/18/2021   Procedure: FIDUCIAL DYE MARKING;  Surgeon: Brenna Adine CROME, DO;  [facility];  Service: Pulmonary;;   INTERCOSTAL NERVE BLOCK  09/18/2021   Procedure: INTERCOSTAL NERVE BLOCK;  Surgeon: Shyrl Linnie KIDD, MD;  Location: [facility] OR;  Service: Thoracic;;   LOBECTOMY  09/18/2021   Procedure: LEFT LOWER LOBECTOMY;  Surgeon: Shyrl Linnie KIDD, MD;  [facility]: [facility] OR;  Service: Thoracic;;   LYMPH NODE DISSECTION  09/18/2021   Procedure: LYMPH NODE DISSECTION;  Surgeon: Shyrl Linnie KIDD, MD;  [facility]: [facility] OR;  Service: Thoracic;;   OOPHORECTOMY     TONSILLECTOMY     TUMOR EXCISION Left    pt states she had a 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 50+ years ago   VIDEO BRONCHOSCOPY WITH ENDOBRONCHIAL NAVIGATION Left 09/18/2021   Procedure: VIDEO BRONCHOSCOPY WITH ENDOBRONCHIAL NAVIGATION;  Surgeon: Brenna Adine CROME, DO;  [facility];  Service: Pulmonary;  Laterality: Left;  ION w/ MB:ICG Fiducial Dye Marking   XI ROBOTIC ASSISTED PARAESOPHAGEAL HERNIA REPAIR N/A 01/28/2022   Procedure: XI ROBOTIC ASSISTED PARAESOPHAGEAL HERNIA REPAIR WITH MYRIAD MESH WITH FUNDOPLICATION;  Surgeon: Shyrl Linnie KIDD, MD;  [facility]: [facility] OR;  Service: Thoracic;  Laterality: N/A;    REVIEW OF SYSTEMS:  A comprehensive review of systems was negative except for: Constitutional: positive for fatigue Musculoskeletal: positive for arthralgias   PHYSICAL EXAMINATION: General appearance: alert, cooperative, fatigued, and no distress Head: Normocephalic, without obvious abnormality, atraumatic Neck: no adenopathy, no JVD, supple,  symmetrical, trachea midline, and thyroid not enlarged, symmetric, no tenderness/mass/nodules Lymph nodes: Cervical, supraclavicular, and axillary nodes normal. Resp: clear to auscultation bilaterally Back: symmetric, no curvature. ROM normal. No CVA tenderness. Cardio: regular rate and rhythm, S1, S2 normal, no murmur, click, rub or gallop GI: soft, non-tender; bowel sounds normal; no masses,  no organomegaly Extremities: extremities normal, atraumatic, no cyanosis or edema  ECOG PERFORMANCE STATUS: 1 - Symptomatic but completely ambulatory  Blood pressure 128/73, pulse 83, temperature (!) 97.3 F (36.3 C), temperature source Temporal, resp. rate 16, height 5' 5 (1.651 m), weight 185 lb (83.9 kg), SpO2 100%.  LABORATORY DATA: Lab Results  Component Value Date   WBC 9.3 05/25/2024   HGB 13.6 05/25/2024   HCT 40.0 05/25/2024   MCV 88.3 05/25/2024   PLT 304 05/25/2024      Chemistry      Component Value Date/Time   NA 140 05/25/2024 1116   NA 137 01/18/2014 0530   K 4.7 05/25/2024 1116   K 3.4 (L) 01/18/2014 0530   CL 103 05/25/2024 1116   CL 104 01/18/2014 0530   CO2 31 05/25/2024 1116   CO2 25 01/18/2014 0530   BUN 18 05/25/2024 1116  BUN 13 01/18/2014 0530   CREATININE 1.04 (H) 05/25/2024 1116   CREATININE 0.69 01/18/2014 0530      Component Value Date/Time   CALCIUM 10.0 05/25/2024 1116   CALCIUM 9.0 01/18/2014 0530   ALKPHOS 65 05/25/2024 1116   ALKPHOS 89 01/15/2014 0654   AST 13 (L) 05/25/2024 1116   ALT 10 05/25/2024 1116   ALT 14 01/15/2014 0654   BILITOT 0.4 05/25/2024 1116       RADIOGRAPHIC STUDIES: CT Chest Wo Contrast Result Date: 05/29/2024 CLINICAL DATA:  Non-small-cell lung cancer status post left lower lobectomy * Tracking Code: BO * EXAM: CT CHEST WITHOUT CONTRAST TECHNIQUE: Multidetector CT imaging of the chest was performed following the standard protocol without IV contrast. RADIATION DOSE REDUCTION: This exam was performed according to the  departmental dose-optimization program which includes automated exposure control, adjustment of the mA and/or kV according to patient size and/or use of iterative reconstruction technique. COMPARISON:  06/01/2023 FINDINGS: Cardiovascular: Aortic atherosclerosis. Normal heart size. Left and right coronary artery calcifications. No pericardial effusion. Mediastinum/Nodes: No enlarged mediastinal, hilar, or axillary lymph nodes. Small hiatal hernia. Thyroid gland, trachea, and esophagus demonstrate no significant findings. Lungs/Pleura: Status post left lower lobectomy. No pleural effusion or pneumothorax. Upper Abdomen: No acute abnormality. Musculoskeletal: No chest wall abnormality. No acute osseous findings. IMPRESSION: 1. Status post left lower lobectomy. 2. No evidence of recurrent or metastatic disease in the chest. 3. Coronary artery disease. Aortic Atherosclerosis (ICD10-I70.0). Electronically Signed   By: Marolyn JONETTA Jaksch M.D.   On: 05/29/2024 17:28    ASSESSMENT AND PLAN: This is a very pleasant 83 years old white female with Stage IA (T1b, N0, M0) non-small cell lung cancer, adenocarcinoma diagnosed in November 2022.  She is status post robotic assisted left lower lobe wedge resection followed by left lower lobectomy on September 18, 2021 under the care of Dr. Shyrl The patient is currently on observation and she is feeling fine. She had repeat CT scan of the chest without contrast performed recently.  I personally and independently reviewed the scan results with the patient today.  She has no evidence for disease recurrence or metastasis. Assessment and Plan Assessment & Plan History of left lower lobe lung adenocarcinoma, post-lobectomy, in remission The lung adenocarcinoma, initially diagnosed in November 2022, is in remission following a left lower lobectomy. Recent CT scan of the chest shows no evidence of disease recurrence or metastasis, indicating remission nearly three years  post-diagnosis. She expressed a desire to transfer her care to the [facility] in [place] for convenience, as her husband is receiving treatment there. 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 She is currently on Plavix, a platelet inhibitor, for secondary prevention of stroke, as prescribed by her primary care physician and confirmed by a neurologist. - Continue Plavix for stroke prevention She was advised to call if she has any concerning symptoms in the interval.   The patient voices understanding of current disease status and treatment options and is in agreement with the current care plan.  All  questions were answered. The patient knows to call the clinic with any problems, questions or concerns. We can certainly see the patient much sooner if necessary.  The total time spent in the appointment was 20 minutes.  Disclaimer: This note was dictated with voice recognition software. Similar sounding words can inadvertently be transcribed and may not be corrected upon review.

## 2024-06-27 ENCOUNTER — Inpatient Hospital Stay: Attending: Internal Medicine | Admitting: Oncology

## 2024-06-27 ENCOUNTER — Inpatient Hospital Stay

## 2024-06-27 ENCOUNTER — Encounter: Payer: Self-pay | Admitting: Oncology

## 2024-06-27 VITALS — BP 120/80 | HR 80 | Temp 97.8°F | Resp 18 | Ht 65.0 in | Wt 185.0 lb

## 2024-06-27 DIAGNOSIS — Z803 Family history of malignant neoplasm of breast: Secondary | ICD-10-CM | POA: Insufficient documentation

## 2024-06-27 DIAGNOSIS — Z8049 Family history of malignant neoplasm of other genital organs: Secondary | ICD-10-CM | POA: Insufficient documentation

## 2024-06-27 DIAGNOSIS — Z85118 Personal history of other malignant neoplasm of bronchus and lung: Secondary | ICD-10-CM | POA: Diagnosis not present

## 2024-06-27 DIAGNOSIS — C3432 Malignant neoplasm of lower lobe, left bronchus or lung: Secondary | ICD-10-CM | POA: Diagnosis not present

## 2024-06-27 DIAGNOSIS — Z800 Family history of malignant neoplasm of digestive organs: Secondary | ICD-10-CM | POA: Diagnosis not present

## 2024-06-27 DIAGNOSIS — Z801 Family history of malignant neoplasm of trachea, bronchus and lung: Secondary | ICD-10-CM | POA: Diagnosis not present

## 2024-06-27 DIAGNOSIS — Z08 Encounter for follow-up examination after completed treatment for malignant neoplasm: Secondary | ICD-10-CM | POA: Insufficient documentation

## 2024-06-27 NOTE — Progress Notes (Unsigned)
 Patient is having her labs done next week, so she doesn't want to have any labs done today.

## 2024-06-28 ENCOUNTER — Telehealth: Payer: Self-pay | Admitting: Oncology

## 2024-06-28 ENCOUNTER — Encounter: Payer: Self-pay | Admitting: Oncology

## 2024-06-28 NOTE — Progress Notes (Signed)
 [facility]  Telephone:(336) [phone] Fax:(336) [phone]  ID: Gail Salinas OB: [birth year]  MR#: [id number]  RDW#:[id number]  Patient Care Team: Alla Amis, MD as PCP - General (Family Medicine) Jacobo Evalene PARAS, MD as Consulting Physician (Oncology)  CHIEF COMPLAINT:  Stage Ia2 adenocarcinoma of the left lower lobe lung.  INTERVAL HISTORY: Patient is an 83 year old female who underwent wedge resection for the above-stated malignancy in November 2022.  She is transitioning providers to be closer to her home.  She currently feels well and is asymptomatic.  She has no neurologic complaints.  She denies any recent fevers or illnesses.  She has a good appetite and denies weight loss.  She has no chest pain, shortness of breath, cough, or hemoptysis.  She denies any nausea, vomiting, constipation, or diarrhea.  She has no urinary complaints.  Patient feels at her baseline and offers no specific complaints today.  REVIEW OF SYSTEMS:   Review of Systems  Constitutional: Negative.  Negative for fever, malaise/fatigue and weight loss.  Respiratory: Negative.  Negative for cough, hemoptysis and shortness of breath.   Cardiovascular: Negative.  Negative for chest pain and leg swelling.  Gastrointestinal: Negative.  Negative for abdominal pain.  Genitourinary: Negative.  Negative for dysuria.  Musculoskeletal: Negative.  Negative for back pain.  Skin: Negative.  Negative for rash.  Neurological: Negative.  Negative for dizziness, focal weakness, weakness and headaches.  Psychiatric/Behavioral: Negative.  The patient is not nervous/anxious.     As per HPI. Otherwise, a complete review of systems is negative.  PAST MEDICAL HISTORY: Past Medical History:  Diagnosis Date   Anxiety    Cancer (HCC) 09/18/2021   COPD (chronic obstructive pulmonary disease) (HCC)    GERD (gastroesophageal reflux disease)    History of hiatal hernia 2022   OCD (obsessive compulsive  disorder)    PONV (postoperative nausea and vomiting)    Nauseated after surgery 40 years ago   Pre-diabetes     PAST SURGICAL HISTORY: Past Surgical History:  Procedure Laterality Date   ABDOMINAL HYSTERECTOMY     BREAST BIOPSY Left 2015   Negative-Fibrocystic changes   BRONCHIAL BRUSHINGS  09/18/2021   Procedure: BRONCHIAL BRUSHINGS;  Surgeon: Brenna Adine CROME, DO;  [facility];  Service: Pulmonary;;   BRONCHIAL NEEDLE ASPIRATION BIOPSY  09/18/2021   Procedure: BRONCHIAL NEEDLE ASPIRATION BIOPSIES;  Surgeon: Brenna Adine CROME, DO;  [facility]: [facility];  Service: Pulmonary;;   COLONOSCOPY WITH PROPOFOL  N/A 05/19/2018   Procedure: COLONOSCOPY WITH PROPOFOL ;  Surgeon: Gaylyn Gladis PENNER, MD;  Location: [facility] ENDOSCOPY;  Service: Endoscopy;  Laterality: N/A;   COLONOSCOPY WITH PROPOFOL  N/A 10/06/2022   Procedure: COLONOSCOPY WITH PROPOFOL ;  Surgeon: Maryruth Ole DASEN, MD;  Location: [facility] ENDOSCOPY;  Service: Endoscopy;  Laterality: N/A;   ESOPHAGOGASTRODUODENOSCOPY N/A 01/28/2022   Procedure: ESOPHAGOGASTRODUODENOSCOPY (EGD);  Surgeon: Shyrl Linnie KIDD, MD;  [facility] OR;  Service: Thoracic;  Laterality: N/A;   FIDUCIAL MARKER PLACEMENT  09/18/2021   Procedure: FIDUCIAL DYE MARKING;  Surgeon: Brenna Adine CROME, DO;  [facility];  Service: Pulmonary;;   INTERCOSTAL NERVE BLOCK  09/18/2021   Procedure: INTERCOSTAL NERVE BLOCK;  Surgeon: Shyrl Linnie KIDD, MD;  [facility] OR;  Service: Thoracic;;   LOBECTOMY  09/18/2021   Procedure: LEFT LOWER LOBECTOMY;  Surgeon: Shyrl Linnie KIDD, MD;  [facility] OR;  Service: Thoracic;;   LYMPH NODE DISSECTION  09/18/2021   Procedure: LYMPH NODE DISSECTION;  Surgeon: Shyrl Linnie KIDD, MD;  [facility] OR;  Service: Thoracic;;   OOPHORECTOMY     TONSILLECTOMY     TUMOR EXCISION Left    pt states she had a 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 50+ years ago   VIDEO BRONCHOSCOPY WITH ENDOBRONCHIAL NAVIGATION  Left 09/18/2021   Procedure: VIDEO BRONCHOSCOPY WITH ENDOBRONCHIAL NAVIGATION;  Surgeon: Brenna Adine CROME, DO;  [facility];  Service: Pulmonary;  Laterality: Left;  ION w/ MB:ICG Fiducial Dye Marking   XI ROBOTIC ASSISTED PARAESOPHAGEAL HERNIA REPAIR N/A 01/28/2022   Procedure: XI ROBOTIC ASSISTED PARAESOPHAGEAL HERNIA REPAIR WITH MYRIAD MESH WITH FUNDOPLICATION;  Surgeon: Shyrl Linnie KIDD, MD;  [facility] OR;  Service: Thoracic;  Laterality: N/A;    FAMILY HISTORY: Family History  Problem Relation Age of Onset   Breast cancer Mother 96   Coronary artery disease Mother    Stroke Mother    Uterine cancer Mother    Vaginal cancer Mother    Colon cancer Father    Lung cancer Father     ADVANCED DIRECTIVES (Y/N):  N  HEALTH MAINTENANCE: Social History   Tobacco Use   Smoking status: Never   Smokeless tobacco: Never  Vaping Use   Vaping status: Never Used  Substance Use Topics   Alcohol use: Not Currently    Comment: very rarely   Drug use: Never     Colonoscopy:  PAP:  Bone density:  Lipid panel:  Allergies  Allergen Reactions   Advair Diskus [Fluticasone-Salmeterol]     Panic attack   Codeine Nausea And Vomiting   Prednisone     Panic attack    Current Outpatient Medications  Medication Sig Dispense Refill   acetaminophen  (TYLENOL ) 325 MG tablet Take 650 mg by mouth every 6 (six) hours as needed for moderate pain.     aspirin  EC 81 MG tablet Take 81 mg by mouth daily.     calcium carbonate (OSCAL) 1500 (600 Ca) MG TABS tablet Take 1,500 mg by mouth 2 (two) times daily with a meal.     DULoxetine  (CYMBALTA ) 30 MG capsule Take 30 mg by mouth daily.     magnesium gluconate (MAGONATE) 500 MG tablet Take 500 mg by mouth 2 (two) times daily.     meclizine (ANTIVERT) 25 MG tablet Take 25 mg by mouth 3 (three) times daily as needed for dizziness.     ondansetron  (ZOFRAN ) 4 MG tablet Take 1 tablet (4 mg total) by mouth every 8 (eight) hours as needed for  nausea or vomiting. 20 tablet 0   potassium chloride  SA (KLOR-CON ) 20 MEQ tablet Take 20 mEq by mouth 4 (four) times daily.     No current facility-administered medications for this visit.    OBJECTIVE: Vitals:   06/27/24 1448  BP: 120/80  Pulse: 80  Resp: 18  Temp: 97.8 F (36.6 C)  SpO2: 98%     Body mass index is 30.79 kg/m.    ECOG FS:0 - Asymptomatic  General: Well-developed, well-nourished, no acute distress. Eyes: Pink conjunctiva, anicteric sclera. HEENT: Normocephalic, moist mucous membranes. Lungs: No audible wheezing or coughing. Heart: Regular rate and rhythm. Abdomen: Soft, nontender, no obvious distention. Musculoskeletal: No edema, cyanosis, or clubbing. Neuro: Alert, answering all questions appropriately. Cranial nerves grossly intact. Skin: No rashes or petechiae noted. Psych: Normal affect. Lymphatics: No cervical, calvicular, axillary or inguinal LAD.   LAB RESULTS:  Lab Results  Component Value Date   NA 140 05/25/2024   K 4.7 05/25/2024   CL 103 05/25/2024   CO2  31 05/25/2024   GLUCOSE 89 05/25/2024   BUN 18 05/25/2024   CREATININE 1.04 (H) 05/25/2024   CALCIUM 10.0 05/25/2024   PROT 7.5 05/25/2024   ALBUMIN  4.3 05/25/2024   AST 13 (L) 05/25/2024   ALT 10 05/25/2024   ALKPHOS 65 05/25/2024   BILITOT 0.4 05/25/2024   GFRNONAA 54 (L) 05/25/2024   GFRAA >60 01/18/2014    Lab Results  Component Value Date   WBC 9.3 05/25/2024   NEUTROABS 5.9 05/25/2024   HGB 13.6 05/25/2024   HCT 40.0 05/25/2024   MCV 88.3 05/25/2024   PLT 304 05/25/2024     STUDIES: No results found.  ASSESSMENT: Stage Ia2 adenocarcinoma of the left lower lobe lung.  PLAN:    Stage Ia2 adenocarcinoma of the left lower lobe lung: Patient underwent wedge resection on September 18, 2021.  She did not require adjuvant XRT or chemotherapy.  Patient's most recent CT scan on May 25, 2024 did not reveal evidence of recurrent or progressive disease.  No intervention is  needed.  Return to clinic in July 2026 for repeat imaging and further evaluation.  Will continue yearly imaging and evaluation until patient is 5 years removed from her surgery in November 2027.    I spent a total of 45 minutes reviewing chart data, face-to-face evaluation with the patient, counseling and coordination of care as detailed above.  Patient expressed understanding and was in agreement with this plan. She also understands that She can call clinic at any time with any questions, concerns, or complaints.    Cancer Staging  Lung cancer [facility]) Staging form: Lung, AJCC 8th Edition - Pathologic stage from 06/28/2024: Stage IA2 (pT1b, pN0, cM0) - Signed by Jacobo Evalene PARAS, MD on 06/28/2024 Stage prefix: Initial diagnosis   Evalene PARAS Jacobo, MD   06/28/2024 12:44 PM

## 2024-06-28 NOTE — Telephone Encounter (Signed)
 Called pt to provide appt info for 1 year out - pt spouse said that they prefer to [facility] of at [place] [facility] - [facility] to [facility]

## 2024-09-20 ENCOUNTER — Other Ambulatory Visit: Payer: Self-pay | Admitting: Family Medicine

## 2024-09-20 DIAGNOSIS — Z1231 Encounter for screening mammogram for malignant neoplasm of breast: Secondary | ICD-10-CM

## 2024-11-06 ENCOUNTER — Ambulatory Visit
Admission: RE | Admit: 2024-11-06 | Discharge: 2024-11-06 | Disposition: A | Discharge status: Home / Self Care | Source: Ambulatory Visit | Attending: Family Medicine | Admitting: Family Medicine

## 2024-11-06 DIAGNOSIS — Z1231 Encounter for screening mammogram for malignant neoplasm of breast: Secondary | ICD-10-CM | POA: Insufficient documentation

## 2025-05-10 ENCOUNTER — Other Ambulatory Visit

## 2025-05-10 ENCOUNTER — Ambulatory Visit (HOSPITAL_COMMUNITY)

## 2025-05-24 ENCOUNTER — Ambulatory Visit: Admitting: Oncology
